# Patient Record
Sex: Male | Born: 1960 | Race: Black or African American | Hispanic: No | Marital: Married | Smoking: Never smoker
Health system: Southern US, Community
[De-identification: ages and names within clinical notes are randomized; demographics above are authoritative.]

## PROBLEM LIST (undated history)

## (undated) ENCOUNTER — Emergency Department (HOSPITAL_BASED_OUTPATIENT_CLINIC_OR_DEPARTMENT_OTHER): Payer: BC Managed Care – PPO | Source: Home / Self Care

## (undated) DIAGNOSIS — N529 Male erectile dysfunction, unspecified: Secondary | ICD-10-CM

## (undated) DIAGNOSIS — Z9889 Other specified postprocedural states: Secondary | ICD-10-CM

## (undated) HISTORY — PX: KNEE SURGERY: SHX244

## (undated) HISTORY — DX: Male erectile dysfunction, unspecified: N52.9

## (undated) HISTORY — DX: Other specified postprocedural states: Z98.890

---

## 2010-03-18 DIAGNOSIS — Z9889 Other specified postprocedural states: Secondary | ICD-10-CM

## 2010-03-18 HISTORY — PX: SHOULDER SURGERY: SHX246

## 2010-03-18 HISTORY — DX: Other specified postprocedural states: Z98.890

## 2010-09-14 LAB — HM COLONOSCOPY

## 2013-09-24 ENCOUNTER — Telehealth: Payer: Self-pay

## 2013-09-24 NOTE — Telephone Encounter (Signed)
LM on VM No pertinent data in chart

## 2013-09-27 ENCOUNTER — Encounter: Payer: Self-pay | Admitting: Internal Medicine

## 2013-09-27 ENCOUNTER — Ambulatory Visit (INDEPENDENT_AMBULATORY_CARE_PROVIDER_SITE_OTHER): Payer: BC Managed Care – PPO | Admitting: Internal Medicine

## 2013-09-27 VITALS — BP 109/67 | HR 66 | Temp 97.6°F | Ht 66.2 in | Wt 204.0 lb

## 2013-09-27 DIAGNOSIS — K589 Irritable bowel syndrome without diarrhea: Secondary | ICD-10-CM

## 2013-09-27 DIAGNOSIS — Z8042 Family history of malignant neoplasm of prostate: Secondary | ICD-10-CM

## 2013-09-27 DIAGNOSIS — R6882 Decreased libido: Secondary | ICD-10-CM

## 2013-09-27 NOTE — Patient Instructions (Signed)
Get your blood work before you leave   Next visit is for a physical exam in 3 months, fasting Please make an appointment    Get records from the previous doctor: Colonoscopy report Recent labs  XRs

## 2013-09-27 NOTE — Progress Notes (Signed)
Subjective:    Patient ID: Jordan CornfieldMichael Danzer, male    DOB: 10-May-1960, 53 y.o.   MRN: [id number]  DOS:  09/27/2013 Type of visit - description: new pt, several concerns  History: IBS? The patient reports that he has one or 2 bowel movements a day, sometimes has a BM  immediately after he eats certain foods such as fruits and wife thinks that maybe IBS. Also complaining of decreased sex drive for the last 2 years and occ  erectile dysfunction. His marriage is healthy, no major stressors in his life. He is also concerned about his family history of prostate cancer, prostate exam?   ROS Denies nausea, vomiting, no constipation/diarrhea although sometimes the stools are loose. No abdominal pain. Does have occasional heartburn. occ  sees red blood in the stools, previous colonoscopy negative, no increasing sx. Denies dysuria, gross hematuria or difficulty urinating. + Nocturia " all my adult life" goes to the bathroom at night 1-2 qhs   Past Medical History  Diagnosis Date  . H/O colonoscopy 2012    [place]    Past Surgical History  Procedure Laterality Date  . Knee surgery      x 3   . Shoulder surgery Left 2012    History   Social History  . Marital Status: Married    Spouse Name: N/A    Number of Children: 0  . Years of Education: N/A   Occupational History  . Warehouse managercollege administrator     Social History Main Topics  . Smoking status: Never Smoker   . Smokeless tobacco: Not on file  . Alcohol Use: No  . Drug Use: Not on file  . Sexual Activity: Not on file   Other Topics Concern  . Not on file   Social History Narrative   [place]oved from [place]     Family History  Problem Relation Age of Onset  . Colon cancer Neg Hx   . Prostate cancer Father 2960  . Diabetes Sister     sister and aunt  . CAD Father 5059       Medication List    Notice As of 09/27/2013 11:59 PM   You have not been prescribed any medications.         Objective:   Physical  Exam BP 109/67  Pulse 66  Temp(Src) 97.6 F (36.4 C)  Ht 5' 6.2" (1.681 m)  Wt 204 lb (92.534 kg)  BMI 32.75 kg/m2  SpO2 94%  General -- alert, well-developed, NAD.  HEENT-- Not pale. Lungs -- normal respiratory effort, no intercostal retractions, no accessory muscle use, and normal breath sounds.  Heart-- normal rate, regular rhythm, no murmur.  Abdomen-- Not distended, good bowel sounds,soft, non-tender. No rebound or rigidity. No mass,organomegaly. Rectal-- No external abnormalities noted. Normal sphincter tone. No rectal masses or tenderness. Stool brown  Prostate--Prostate gland firm and smooth, no enlargement, nodularity, tenderness, mass, asymmetry or induration. Extremities-- no pretibial edema bilaterally  Neurologic--  alert & oriented X3. Speech normal, gait appropriate for age, strength symmetric and appropriate for age.  Psych-- Cognition and judgment appear intact. Cooperative with normal attention span and concentration. No anxious or depressed appearing.        Assessment & Plan:   IBS? Symptoms do not suggest IBS. He sees red blood per rectum sometimes but that's her chronic issue, on and off, had a negative colonoscopy before. He does have occasional heartburn. Check a CBC to rule out anemia.  Family history  of prostate cancer, DRE negative, check a PSA.  Decreased sex drive, ED: Check a testosterone level, TSH, BMP

## 2013-09-27 NOTE — Progress Notes (Signed)
Pre visit review using our clinic review tool, if applicable. No additional management support is needed unless otherwise documented below in the visit note. 

## 2013-09-28 LAB — BASIC METABOLIC PANEL
BUN: 10 mg/dL (ref 6–23)
CO2: 26 meq/L (ref 19–32)
Calcium: 9.5 mg/dL (ref 8.4–10.5)
Chloride: 104 mEq/L (ref 96–112)
Creatinine, Ser: 1.1 mg/dL (ref 0.4–1.5)
GFR: 89.43 mL/min (ref >60.00)
GLUCOSE: 110 mg/dL — AB (ref 70–99)
POTASSIUM: 3.8 meq/L (ref 3.5–5.1)
SODIUM: 139 meq/L (ref 135–145)

## 2013-09-28 LAB — TESTOSTERONE, FREE, TOTAL, SHBG
Sex Hormone Binding: 35 nmol/L (ref 13–71)
TESTOSTERONE FREE: 54.2 pg/mL (ref 47.0–244.0)
TESTOSTERONE-% FREE: 1.9 % (ref 1.6–2.9)
Testosterone: 287 ng/dL — ABNORMAL LOW (ref 300–890)

## 2013-09-28 LAB — CBC WITH DIFFERENTIAL/PLATELET
Basophils Absolute: 0 10*3/uL (ref 0.0–0.1)
Basophils Relative: 0.4 % (ref 0.0–3.0)
EOS PCT: 4 % (ref 0.0–5.0)
Eosinophils Absolute: 0.2 10*3/uL (ref 0.0–0.7)
HCT: 42 % (ref 39.0–52.0)
Hemoglobin: 13.8 g/dL (ref 13.0–17.0)
Lymphocytes Relative: 41.4 % (ref 12.0–46.0)
Lymphs Abs: 2.3 10*3/uL (ref 0.7–4.0)
MCHC: 33 g/dL (ref 30.0–36.0)
MCV: 88.4 fl (ref 78.0–100.0)
Monocytes Absolute: 0.7 10*3/uL (ref 0.1–1.0)
Monocytes Relative: 12.1 % — ABNORMAL HIGH (ref 3.0–12.0)
NEUTROS PCT: 42.1 % — AB (ref 43.0–77.0)
Neutro Abs: 2.3 10*3/uL (ref 1.4–7.7)
Platelets: 227 10*3/uL (ref 150.0–400.0)
RBC: 4.75 Mil/uL (ref 4.22–5.81)
RDW: 13.4 % (ref 11.5–15.5)
WBC: 5.5 10*3/uL (ref 4.0–10.5)

## 2013-09-28 LAB — TSH: TSH: 0.65 u[IU]/mL (ref 0.35–4.50)

## 2013-09-28 LAB — PSA: PSA: 2.63 ng/mL (ref 0.10–4.00)

## 2013-09-29 ENCOUNTER — Encounter: Payer: Self-pay | Admitting: *Deleted

## 2013-12-20 ENCOUNTER — Encounter: Payer: Self-pay | Admitting: Internal Medicine

## 2013-12-20 ENCOUNTER — Ambulatory Visit (INDEPENDENT_AMBULATORY_CARE_PROVIDER_SITE_OTHER): Payer: BC Managed Care – PPO | Admitting: Internal Medicine

## 2013-12-20 VITALS — BP 131/74 | HR 72 | Temp 98.4°F | Ht 66.0 in | Wt 207.0 lb

## 2013-12-20 DIAGNOSIS — K589 Irritable bowel syndrome without diarrhea: Secondary | ICD-10-CM

## 2013-12-20 DIAGNOSIS — Z0000 Encounter for general adult medical examination without abnormal findings: Secondary | ICD-10-CM

## 2013-12-20 DIAGNOSIS — Z23 Encounter for immunization: Secondary | ICD-10-CM

## 2013-12-20 NOTE — Assessment & Plan Note (Addendum)
Tdap today Had a flu shot 2 weeks ago Had a colonoscopy in 2012, polyps?. Asked patient to get reports. Diet exercise discussed Recent PSA normal. EKG for baseline today-- nsr  Labs, see instructions Recently complaining of decreased libido-ED, here lately is doing better, free testosterone was in the low side of normal, plan to recheck testosterone level next year. Also ?ADD ---> provided name of our counselors

## 2013-12-20 NOTE — Patient Instructions (Signed)
Stop by the front desk and schedule labs to be done within few days (fasting)  Please get the records of the previous colonoscopy including when they requested  you to go back  Please come back to the office in 1 year  for a physical exam. Come back fasting      REMINDERS  If we are ordering labs, XRs or referring you to a specialist : we will always communicate to you the results or the time of the appointment within few days. If you don't hear from us please call the office

## 2013-12-20 NOTE — Progress Notes (Signed)
Pre visit review using our clinic review tool, if applicable. No additional management support is needed unless otherwise documented below in the visit note. 

## 2013-12-20 NOTE — Assessment & Plan Note (Signed)
See previous OV note, sx suggested IBS, h/o Cscope 2012, recent CBC normal Plan-- observe for now

## 2013-12-20 NOTE — Progress Notes (Signed)
   Subjective:    Patient ID: Jordan Melendez, male    DOB: 1960/04/25, 53 y.o.   MRN: [id number]  DOS:  12/20/2013 Type of visit - description : CPX Interval history: Since the last office visit he is doing well. Here for physical exam    ROS Denies chest pain, difficulty breathing. No heartburn per se, no dysphagia or odynophagia. No cough or bronchial congestion. Some stress at work, no anxiety- depression per se. For long time he suspected he has some degree of ADD.   Past Medical History  Diagnosis Date  . H/O colonoscopy 2012    [place] , [place]    Past Surgical History  Procedure Laterality Date  . Knee surgery      x 3   . Shoulder surgery Left 2012    History   Social History  . Marital Status: Married    Spouse Name: N/A    Number of Children: 0  . Years of Education: N/A   Occupational History  . Warehouse managercollege administrator     Social History Main Topics  . Smoking status: Never Smoker   . Smokeless tobacco: Not on file  . Alcohol Use: No  . Drug Use: Not on file  . Sexual Activity: Not on file   Other Topics Concern  . Not on file   Social History Narrative   [place]   Household-- pt and wife     Family History  Problem Relation Age of Onset  . Colon cancer Neg Hx   . Prostate cancer Father 2360  . Diabetes Sister     sister and aunt  . CAD Father 8459  . Stroke Other     GM , late onset   . Hypertension Other     several fam members        Medication List    Notice As of 12/20/2013  5:49 PM   You have not been prescribed any medications.         Objective:   Physical Exam BP 131/74  Pulse 72  Temp(Src) 98.4 F (36.9 C) (Oral)  Ht 5\' 6"  (1.676 m)  Wt 207 lb (93.895 kg)  BMI 33.43 kg/m2  SpO2 97% General -- alert, well-developed, NAD.  Neck --no thyromegaly , normal carotid pulse  HEENT-- Not pale.  Lungs -- normal respiratory effort, no intercostal retractions, no accessory muscle use, and normal breath  sounds.  Heart-- normal rate, regular rhythm, no murmur.  Abdomen-- Not distended, good bowel sounds,soft, non-tender. Extremities-- no pretibial edema bilaterally  Neurologic--  alert & oriented X3. Speech normal, gait appropriate for age, strength symmetric and appropriate for age.  Psych-- Cognition and judgment appear intact. Cooperative with normal attention span and concentration. No anxious or depressed appearing.     Assessment & Plan:

## 2013-12-31 ENCOUNTER — Other Ambulatory Visit (INDEPENDENT_AMBULATORY_CARE_PROVIDER_SITE_OTHER): Payer: BC Managed Care – PPO

## 2013-12-31 DIAGNOSIS — Z0000 Encounter for general adult medical examination without abnormal findings: Secondary | ICD-10-CM

## 2013-12-31 LAB — AST: AST: 21 U/L (ref 0–37)

## 2013-12-31 LAB — LIPID PANEL
Cholesterol: 167 mg/dL (ref 0–200)
HDL: 28.7 mg/dL — ABNORMAL LOW (ref >39.00)
LDL Cholesterol: 118 mg/dL — ABNORMAL HIGH (ref 0–99)
NONHDL: 138.3
Total CHOL/HDL Ratio: 6
Triglycerides: 101 mg/dL (ref 0.0–149.0)
VLDL: 20.2 mg/dL (ref 0.0–40.0)

## 2013-12-31 LAB — HEMOGLOBIN A1C: Hgb A1c MFr Bld: 6 % (ref 4.6–6.5)

## 2013-12-31 LAB — ALT: ALT: 23 U/L (ref 0–53)

## 2014-04-12 ENCOUNTER — Ambulatory Visit (HOSPITAL_COMMUNITY): Payer: BC Managed Care – PPO

## 2014-04-12 ENCOUNTER — Encounter: Payer: Self-pay | Admitting: Medical

## 2014-04-12 ENCOUNTER — Ambulatory Visit (HOSPITAL_BASED_OUTPATIENT_CLINIC_OR_DEPARTMENT_OTHER)
Admission: RE | Admit: 2014-04-12 | Discharge: 2014-04-12 | Disposition: A | Discharge status: Home / Self Care | Payer: BC Managed Care – PPO | Source: Ambulatory Visit | Attending: Medical | Admitting: Medical

## 2014-04-12 ENCOUNTER — Ambulatory Visit (INDEPENDENT_AMBULATORY_CARE_PROVIDER_SITE_OTHER): Payer: BC Managed Care – PPO | Admitting: Medical

## 2014-04-12 VITALS — BP 126/74 | HR 65 | Temp 98.3°F | Ht 66.0 in | Wt 203.4 lb

## 2014-04-12 DIAGNOSIS — M25571 Pain in right ankle and joints of right foot: Secondary | ICD-10-CM

## 2014-04-12 DIAGNOSIS — M79671 Pain in right foot: Secondary | ICD-10-CM | POA: Insufficient documentation

## 2014-04-12 DIAGNOSIS — M79673 Pain in unspecified foot: Secondary | ICD-10-CM | POA: Insufficient documentation

## 2014-04-12 NOTE — Assessment & Plan Note (Signed)
For you right foot pain, you may have corn, callous  Or  plantar wart. I recommend low dose ibuprofen otc 200 mg every 8 hours for pain and use mole skin/other type product to reduce friction/ give padding  With you prediabetic stat.us, I will refer yo to podiatrist for tx

## 2014-04-12 NOTE — Progress Notes (Signed)
   Subjective:    Patient ID: Jordan Melendez, male    DOB: 30-May-1960, 54 y.o.   MRN: [id number]  HPI   About one month ago pain after pedicure. He does not report any partictular trauma to his foot from pedicure but speculated maybe.  Pain started about 3 weeks ago(one week after pedicure). No injury of fall. Pt is prediabetic. He described area that feels little hard and is tender below rt 5th toe. He points to 5th  distal metatarsal head region.  Pt not wearing new shoes. He does not report friction injury.  Mild irritating pain. 3-4/10 level pain but gradually getting worse.  Past Medical History  Diagnosis Date  . H/O colonoscopy 2012    [place] , [place]    History   Social History  . Marital Status: Married    Spouse Name: N/A    Number of Children: 0  . Years of Education: N/A   Occupational History  . Warehouse managercollege administrator     Social History Main Topics  . Smoking status: Never Smoker   . Smokeless tobacco: Not on file  . Alcohol Use: No  . Drug Use: Not on file  . Sexual Activity: Not on file   Other Topics Concern  . Not on file   Social History Narrative   [place]oved from [place] 2014   Household-- pt and wife    Past Surgical History  Procedure Laterality Date  . Knee surgery      x 3   . Shoulder surgery Left 2012    Family History  Problem Relation Age of Onset  . Colon cancer Neg Hx   . Prostate cancer Father 160  . Diabetes Sister     sister and aunt  . CAD Father 5159  . Stroke Other     GM , late onset   . Hypertension Other     several fam members     No Known Allergies  No current outpatient prescriptions on file prior to visit.   No current facility-administered medications on file prior to visit.    BP 126/74 mmHg  Pulse 65  Temp(Src) 98.3 F (36.8 C) (Oral)  Ht 5\' 6"  (1.676 m)  Wt 203 lb 6.4 oz (92.262 kg)  BMI 32.85 kg/m2  SpO2 94%     Review of Systems  Constitutional: Negative for fever, chills and fatigue.    Endocrine: Negative for polyphagia and polyuria.  Musculoskeletal:       Rt foot pain. See hop for location description.  Skin: Negative for rash.       Objective:   Physical Exam   General- no acute distress.  Rt foot- base of fifth toe.feels like corn verses plantar wart. The area of skin feels thickened an little raised. When squeezed on side moderate pain. On close inspsection with magnification does not appear plantar wart like. No breakdown of skin. No redness or warmth. Normal pulses.        Assessment & Plan:  Pt heel looks good. I don't know why staff put heel injury. He did not have any heel injury.

## 2014-04-12 NOTE — Patient Instructions (Addendum)
For you right foot pain, you may have corn vs plantar wart. I recommend low dose ibuprofen otc 200 mg every 8 hours for pain and use mole skin/other type product to reduce friction.  With you prediabetic stat.us, I will refer yo to podiatrist for tx  I do want you to get xray of your rt foot today.  Follow up with us as needed before your podiatrist appointment

## 2014-04-12 NOTE — Progress Notes (Signed)
Pre visit review using our clinic review tool, if applicable. No additional management support is needed unless otherwise documented below in the visit note. 

## 2014-04-20 ENCOUNTER — Telehealth: Payer: Self-pay

## 2014-04-20 NOTE — Telephone Encounter (Signed)
Received [facility] call form via fax regarding Pt. Pt requested someone at office call back. Returned Pts call and spoke with Pt regarding erectile dysfunction. Pt informed me at last visit his testosterone is slightly low and he is having more problems. He wanted to call to see if he could get something called into his pharmacy( Pt is currently out of town, will get address of local pharmacy if medication is approved), informed Pt I would send a message to Dr. Drue NovelPaz regarding ED medication. Pt verbalized understanding.

## 2014-04-20 NOTE — Telephone Encounter (Signed)
Ok to send viagra 100 mg: 1/2 to 1 tab a day prn #5, 1 RF Needs to read s/e carefully, stop viagra and call if s/e

## 2014-04-20 NOTE — Telephone Encounter (Signed)
Spoke with Pt, informed him Dr. Drue NovelPaz has agreed to the Viagra 100 mg: 1/2 to 1 tablet prn. Pt will call back with pharmacy information to send the prescription to.

## 2014-04-21 MED ORDER — SILDENAFIL CITRATE 100 MG PO TABS: 50.0000 mg | ORAL_TABLET | Freq: Every day | ORAL | Status: DC | PRN

## 2014-04-21 NOTE — Telephone Encounter (Signed)
Received fax from [facility] regarding pharmacy information to send Viagra. Viagra sent to [facility]: [address]  [place], [place].

## 2014-04-28 ENCOUNTER — Ambulatory Visit: Payer: BC Managed Care – PPO | Admitting: Podiatry

## 2014-05-05 ENCOUNTER — Ambulatory Visit: Payer: BC Managed Care – PPO

## 2014-05-05 ENCOUNTER — Encounter: Payer: Self-pay | Admitting: Podiatry

## 2014-05-05 ENCOUNTER — Ambulatory Visit (INDEPENDENT_AMBULATORY_CARE_PROVIDER_SITE_OTHER): Payer: BC Managed Care – PPO | Admitting: Podiatry

## 2014-05-05 VITALS — BP 106/68 | HR 71 | Resp 16 | Ht 66.0 in | Wt 202.0 lb

## 2014-05-05 DIAGNOSIS — M779 Enthesopathy, unspecified: Secondary | ICD-10-CM

## 2014-05-05 DIAGNOSIS — Q828 Other specified congenital malformations of skin: Secondary | ICD-10-CM

## 2014-05-05 NOTE — Progress Notes (Signed)
   Subjective:    Patient ID: Jordan Melendez, male    DOB: 06-01-1960, 54 y.o.   MRN: [id number]  HPI Comments: Feels like a cracked callus, it has been there a couple of month, doctor xrayed it. Use moleskin on it  Foot Pain      Review of Systems  HENT:       Cold   All other systems reviewed and are negative.      Objective:   Physical Exam: I have reviewed his past mental history medications allergies surgery social history and review of systems. Pulses are strongly palpable bilateral. Neurologic sensorium is intact and deep tendon reflexes are intact. Muscle strength is adequate bilateral. Orthopedic evaluation of his straight palpable area of inflammation beneath fifth metatarsal head of the right foot with an overlying reactive hyperkeratotic lesion or porokeratosis. This does not appear to be verrucoid in nature.        Assessment & Plan:  Assessment: Capsulitis porokeratosis fifth metatarsophalangeal joint right foot.  Plan: Injected today with dexamethasone and local anesthetic 2 mg was utilized. Debrided reactive hyperkeratoses discussed appropriate shoe gear. I will follow-up with him in the near future if necessary.

## 2014-12-21 ENCOUNTER — Telehealth: Payer: Self-pay

## 2014-12-21 NOTE — Telephone Encounter (Signed)
Attempted PVC pt states he will call back

## 2014-12-23 ENCOUNTER — Encounter: Payer: BC Managed Care – PPO | Admitting: Internal Medicine

## 2014-12-23 ENCOUNTER — Telehealth: Payer: Self-pay | Admitting: Internal Medicine

## 2015-01-02 NOTE — Telephone Encounter (Signed)
No charge. 

## 2015-01-02 NOTE — Telephone Encounter (Signed)
PT WAS NO SHOW 12/23/14 9:00AM, CPE APPT, PT IS RESCHEDULED FOR 01/04/15, CHARGE OR NO CHARGE?

## 2015-01-04 ENCOUNTER — Ambulatory Visit (INDEPENDENT_AMBULATORY_CARE_PROVIDER_SITE_OTHER): Payer: BC Managed Care – PPO | Admitting: Internal Medicine

## 2015-01-04 ENCOUNTER — Encounter: Payer: Self-pay | Admitting: Internal Medicine

## 2015-01-04 VITALS — BP 106/66 | HR 66 | Temp 97.7°F | Ht 66.0 in | Wt 205.0 lb

## 2015-01-04 DIAGNOSIS — Z114 Encounter for screening for human immunodeficiency virus [HIV]: Secondary | ICD-10-CM

## 2015-01-04 DIAGNOSIS — Z09 Encounter for follow-up examination after completed treatment for conditions other than malignant neoplasm: Secondary | ICD-10-CM

## 2015-01-04 DIAGNOSIS — Z0000 Encounter for general adult medical examination without abnormal findings: Secondary | ICD-10-CM

## 2015-01-04 DIAGNOSIS — R7303 Prediabetes: Secondary | ICD-10-CM | POA: Diagnosis not present

## 2015-01-04 DIAGNOSIS — Z1159 Encounter for screening for other viral diseases: Secondary | ICD-10-CM

## 2015-01-04 LAB — BASIC METABOLIC PANEL
BUN: 13 mg/dL (ref 6–23)
CALCIUM: 9.3 mg/dL (ref 8.4–10.5)
CO2: 31 mEq/L (ref 19–32)
CREATININE: 1.13 mg/dL (ref 0.40–1.50)
Chloride: 104 mEq/L (ref 96–112)
GFR: 87.18 mL/min (ref >60.00)
Glucose, Bld: 90 mg/dL (ref 70–99)
Potassium: 4 mEq/L (ref 3.5–5.1)
SODIUM: 141 meq/L (ref 135–145)

## 2015-01-04 LAB — PSA: PSA: 2.18 ng/mL (ref 0.10–4.00)

## 2015-01-04 LAB — LIPID PANEL
CHOLESTEROL: 176 mg/dL (ref 0–200)
HDL: 40.1 mg/dL (ref >39.00)
LDL Cholesterol: 113 mg/dL — ABNORMAL HIGH (ref 0–99)
NONHDL: 135.94
TRIGLYCERIDES: 114 mg/dL (ref 0.0–149.0)
Total CHOL/HDL Ratio: 4
VLDL: 22.8 mg/dL (ref 0.0–40.0)

## 2015-01-04 LAB — HEMOGLOBIN A1C: HEMOGLOBIN A1C: 5.7 % (ref 4.6–6.5)

## 2015-01-04 NOTE — Progress Notes (Signed)
Subjective:    Patient ID: Jordan Melendez, male    DOB: 06/08/1960, 54 y.o.   MRN: [id number]  DOS:  01/04/2015 Type of visit - description :  CPX Interval history: In general feeling well.  Wt Readings from Last 3 Encounters:  01/04/15 205 lb (92.987 kg)  05/05/14 202 lb (91.627 kg)  04/12/14 203 lb 6.4 oz (92.262 kg)     Review of Systems Constitutional: No fever. No chills. No unexplained wt changes. No unusual sweats  HEENT: No dental problems, no ear discharge, no facial swelling, no voice changes. No eye discharge, no eye  redness , no  intolerance to light   Respiratory: No wheezing , no  difficulty breathing. No cough , no mucus production  Cardiovascular: No CP, no leg swelling , no  Palpitations  GI: no nausea, no vomiting, no diarrhea , no  abdominal pain.  No blood in the stools. No dysphagia, no odynophagia    Endocrine: No polyphagia, no polyuria , no polydipsia  GU: No dysuria, gross hematuria, difficulty urinating. No urinary urgency, no frequency.  Musculoskeletal: No joint swellings , in need of right shoulder surgery at some point in the next few months  Skin: No change in the color of the skin, palor , no  Rash  Allergic, immunologic: No environmental allergies , no  food allergies  Neurological: No dizziness no  syncope. No headaches. No diplopia, no slurred, no slurred speech, no motor deficits, no facial  Numbness  Hematological: No enlarged lymph nodes, no easy bruising , no unusual bleedings  Psychiatry: No suicidal ideas, no hallucinations, no beavior problems, no confusion.  No unusual/severe anxiety, no depression   Past Medical History  Diagnosis Date  . H/O colonoscopy 2012    [place]    Past Surgical History  Procedure Laterality Date  . Knee surgery      x 3   . Shoulder surgery Left 2012    Social History   Social History  . Marital Status: Married    Spouse Name: N/A  . Number of Children: 0  . Years of  Education: N/A   Occupational History  . Warehouse managercollege administrator     Social History Main Topics  . Smoking status: Never Smoker   . Smokeless tobacco: Not on file  . Alcohol Use: No  . Drug Use: Not on file  . Sexual Activity: Not on file   Other Topics Concern  . Not on file   Social History Narrative   [place]oved from [place]   Household-- pt and wife     Family History  Problem Relation Age of Onset  . Colon cancer Neg Hx   . Prostate cancer Father 1860  . Diabetes Sister     sister and aunt  . CAD Father 3459  . Stroke Other     GM , late onset   . Hypertension Other     several fam members        Medication List    Notice  As of 01/04/2015  5:54 PM   You have not been prescribed any medications.         Objective:   Physical Exam BP 106/66 mmHg  Pulse 66  Temp(Src) 97.7 F (36.5 C) (Oral)  Ht 5\' 6"  (1.676 m)  Wt 205 lb (92.987 kg)  BMI 33.10 kg/m2  SpO2 97% General:   Well developed, well nourished . NAD.  Neck:  Full range of motion. Supple.  No  thyromegaly , normal carotid pulse HEENT:  Normocephalic . Face symmetric, atraumatic Lungs:  CTA B Normal respiratory effort, no intercostal retractions, no accessory muscle use. Heart: RRR,  no murmur.  No pretibial edema bilaterally  Abdomen:  Not distended, soft, non-tender. No rebound or rigidity.  Rectal:  External abnormalities: none. Normal sphincter tone. No rectal masses or tenderness.  Stool brown  Prostate: Prostate gland firm and smooth, no enlargement, nodularity, tenderness, mass, asymmetry or induration.  Skin: Exposed areas without rash. Not pale. Not jaundice Neurologic:  alert & oriented X3.  Speech normal, gait appropriate for age and unassisted Strength symmetric and appropriate for age.  Psych: Cognition and judgment appear intact.  Cooperative with normal attention span and concentration.  Behavior appropriate. No anxious or depressed appearing.    Assessment & Plan:    Assessment> Prediabetes  ED- infrequent sx  MSK: See surgical history.  Plan  Diabetes: Diet and exercise discussed, check A1c Last year free testosterone was in the lower  side of normal. Recheck. RTC one year

## 2015-01-04 NOTE — Assessment & Plan Note (Signed)
Diabetes: Diet and exercise discussed, check A1c Last year free testosterone was in the lower  side of normal. Recheck. RTC one year

## 2015-01-04 NOTE — Progress Notes (Signed)
Pre visit review using our clinic review tool, if applicable. No additional management support is needed unless otherwise documented below in the visit note. 

## 2015-01-04 NOTE — Patient Instructions (Signed)
Get your blood work before you leave   Nutritionist Rodman CompSarah Swindell [phone]  Next visit  for a  complete physical exam in one year.     Please schedule an appointment at the front desk Please come back fasting

## 2015-01-04 NOTE — Assessment & Plan Note (Addendum)
Tdap 2015 Plans to get a  flu shot soon +FH DM,CAD, prostate ca  Had a colonoscopy in 2012, polyps? No report, asked pt to get it  DRE normal today, check a PSA Diet exercise discussed  , likes to see a nutritionist, see AVS  Labs, see instructions

## 2015-01-05 LAB — TESTOSTERONE, FREE, TOTAL, SHBG
Sex Hormone Binding: 30 nmol/L (ref 10–50)
Testosterone, Free: 76.8 pg/mL (ref 47.0–244.0)
Testosterone-% Free: 2.1 % (ref 1.6–2.9)
Testosterone: 362 ng/dL (ref 300–890)

## 2015-01-05 LAB — HIV ANTIBODY (ROUTINE TESTING W REFLEX): HIV: NONREACTIVE

## 2015-01-05 LAB — HEPATITIS C ANTIBODY: HCV AB: NEGATIVE

## 2015-03-22 ENCOUNTER — Encounter: Payer: Self-pay | Admitting: Internal Medicine

## 2015-03-22 ENCOUNTER — Ambulatory Visit (INDEPENDENT_AMBULATORY_CARE_PROVIDER_SITE_OTHER): Payer: BC Managed Care – PPO | Admitting: Internal Medicine

## 2015-03-22 VITALS — BP 112/76 | HR 66 | Temp 97.6°F | Ht 66.0 in | Wt 207.0 lb

## 2015-03-22 DIAGNOSIS — H9312 Tinnitus, left ear: Secondary | ICD-10-CM

## 2015-03-22 NOTE — Progress Notes (Signed)
   Subjective:    Patient ID: Jordan Melendez, male    DOB: [birth year], 55 y.o.   MRN: [id number]  DOS:  03/22/2015 Type of visit - description : Acute visit Interval history: Symptoms started suddenly 3 days ago with mild left ear tinnitus, muffled hearing Since then, the symptoms are not better or worse.  Review of Systems Denies any fever chills No dizziness No actual ear pain or discharge No recent URI symptoms  Past Medical History  Diagnosis Date  . H/O colonoscopy 2012    [place]    Past Surgical History  Procedure Laterality Date  . Knee surgery      x 3   . Shoulder surgery Left 2012    Social History   Social History  . Marital Status: Married    Spouse Name: N/A  . Number of Children: 0  . Years of Education: N/A   Occupational History  . Warehouse managercollege administrator     Social History Main Topics  . Smoking status: Never Smoker   . Smokeless tobacco: Not on file  . Alcohol Use: No  . Drug Use: Not on file  . Sexual Activity: Not on file   Other Topics Concern  . Not on file   Social History Narrative   [place]oved from [place]   Household-- pt and wife        Medication List    Notice  As of 03/22/2015  6:38 PM   You have not been prescribed any medications.         Objective:   Physical Exam BP 112/76 mmHg  Pulse 66  Temp(Src) 97.6 F (36.4 C) (Oral)  Ht 5\' 6"  (1.676 m)  Wt 207 lb (93.895 kg)  BMI 33.43 kg/m2  SpO2 99% General:   Well developed, well nourished . NAD.  HEENT:  Normocephalic . Face symmetric, atraumatic. Nose: Nose congested R TM: Normal, mild amount of wax noted, partially removed with a spoon Left TM: Slightly bulge, no red, no discharge, no wax.  throat: Symmetric, no red no discharge Neurologic:  alert & oriented X3.  Speech normal, gait appropriate for age and unassisted Psych--  Cognition and judgment appear intact.  Cooperative with normal attention span and concentration.  Behavior  appropriate. No anxious or depressed appearing.      Assessment & Plan:   Assessment> Prediabetes  ED- infrequent sx  MSK: See surgical history.  Plan    Tinnitus: Mild tinnitus and muffled hearing, exam is essentially normal except for possible serous otitis. Recommend Flonase consistently for the next few weeks, if not improving he will call for ENT referral. Next visit for a CPX 12-2015

## 2015-03-22 NOTE — Patient Instructions (Addendum)
Use flonase 2 sprays on each side of the nose every day x 3-4 weeks  If no better in 10-14 days: call the office for a referral   Nutritionist Rodman CompSarah Swindell [phone]

## 2015-03-22 NOTE — Progress Notes (Signed)
Pre visit review using our clinic review tool, if applicable. No additional management support is needed unless otherwise documented below in the visit note. 

## 2015-05-05 DIAGNOSIS — Q828 Other specified congenital malformations of skin: Secondary | ICD-10-CM

## 2015-07-21 ENCOUNTER — Ambulatory Visit (INDEPENDENT_AMBULATORY_CARE_PROVIDER_SITE_OTHER): Payer: BC Managed Care – PPO | Admitting: Internal Medicine

## 2015-07-21 ENCOUNTER — Encounter: Payer: Self-pay | Admitting: Internal Medicine

## 2015-07-21 VITALS — BP 90/50 | HR 70 | Temp 97.9°F | Ht 66.0 in | Wt 207.4 lb

## 2015-07-21 DIAGNOSIS — M25511 Pain in right shoulder: Secondary | ICD-10-CM | POA: Diagnosis not present

## 2015-07-21 DIAGNOSIS — Z09 Encounter for follow-up examination after completed treatment for conditions other than malignant neoplasm: Secondary | ICD-10-CM

## 2015-07-21 NOTE — Progress Notes (Signed)
Pre visit review using our clinic review tool, if applicable. No additional management support is needed unless otherwise documented below in the visit note. 

## 2015-07-21 NOTE — Patient Instructions (Signed)
  Nutritionist Rodman CompSarah Swindell [phone]

## 2015-07-21 NOTE — Progress Notes (Signed)
   Subjective:    Patient ID: Jordan Melendez, male    DOB: [birth year], 55 y.o.   MRN: [id number]  DOS:  07/21/2015 Type of visit - description : Acute visit Interval history:  On and off right shoulder pain anteriorly for a while, similar to previous issues on the left side where he had surgery and is now doing well. Request orthopedic referral.  Review of Systems   denies neck pain No lower extremity paresthesias Past Medical History  Diagnosis Date  . H/O colonoscopy 2012    [place]    Past Surgical History  Procedure Laterality Date  . Knee surgery      x 3   . Shoulder surgery Left 2012    Social History   Social History  . Marital Status: Married    Spouse Name: N/A  . Number of Children: 0  . Years of Education: N/A   Occupational History  . Warehouse managercollege administrator     Social History Main Topics  . Smoking status: Never Smoker   . Smokeless tobacco: Not on file  . Alcohol Use: No  . Drug Use: Not on file  . Sexual Activity: Not on file   Other Topics Concern  . Not on file   Social History Narrative   [place]oved from [place]   Household-- pt and wife        Medication List    Notice  As of 07/21/2015 11:59 PM   You have not been prescribed any medications.         Objective:   Physical Exam BP 90/50 mmHg  Pulse 70  Temp(Src) 97.9 F (36.6 C) (Oral)  Ht 5\' 6"  (1.676 m)  Wt 207 lb 6.4 oz (94.076 kg)  BMI 33.49 kg/m2  SpO2 96% General:   Well developed, well nourished . NAD.  HEENT:  Normocephalic . Face symmetric, atraumatic MSK: Left shoulder normal Right shoulder: No TTP, no deformities, range of motion is definitely decreased compared to the left. Skin: Not pale. Not jaundice Neurologic:  alert & oriented X3.  Speech normal, gait appropriate for age and unassisted Psych--  Cognition and judgment appear intact.  Cooperative with normal attention span and concentration.  Behavior appropriate. No anxious or depressed  appearing.      Assessment & Plan:   Assessment> Prediabetes  ED- infrequent sx  MSK: See surgical history.  Plan: Right shoulder pain: Chronic issue, not taking any medication, range of motion is significantly decrease (frozen shoulder?). Will refer to orthopedic surgery Additionally, unable to sleep more than 5 or 6 hours but denies daytime somnolence, would like to sleep more >>> tips for healthy sleep provided. Melatonin OTC okay as well. RTC as schedule 12-2015.

## 2015-07-22 NOTE — Assessment & Plan Note (Signed)
Right shoulder pain: Chronic issue, not taking any medication, range of motion is significantly decrease (frozen shoulder?). Will refer to orthopedic surgery Additionally, unable to sleep more than 5 or 6 hours but denies daytime somnolence, would like to sleep more >>> tips for healthy sleep provided. Melatonin OTC okay as well. RTC as schedule 12-2015.

## 2016-01-09 ENCOUNTER — Encounter: Payer: Self-pay | Admitting: Internal Medicine

## 2016-01-09 ENCOUNTER — Ambulatory Visit (INDEPENDENT_AMBULATORY_CARE_PROVIDER_SITE_OTHER): Payer: BC Managed Care – PPO | Admitting: Internal Medicine

## 2016-01-09 ENCOUNTER — Telehealth: Payer: Self-pay

## 2016-01-09 VITALS — BP 118/68 | HR 59 | Temp 97.6°F | Resp 14 | Ht 66.0 in | Wt 201.0 lb

## 2016-01-09 DIAGNOSIS — R319 Hematuria, unspecified: Secondary | ICD-10-CM

## 2016-01-09 DIAGNOSIS — Z125 Encounter for screening for malignant neoplasm of prostate: Secondary | ICD-10-CM | POA: Diagnosis not present

## 2016-01-09 DIAGNOSIS — R739 Hyperglycemia, unspecified: Secondary | ICD-10-CM | POA: Diagnosis not present

## 2016-01-09 DIAGNOSIS — Z0000 Encounter for general adult medical examination without abnormal findings: Secondary | ICD-10-CM

## 2016-01-09 DIAGNOSIS — Z0001 Encounter for general adult medical examination with abnormal findings: Secondary | ICD-10-CM | POA: Diagnosis not present

## 2016-01-09 LAB — CBC WITH DIFFERENTIAL/PLATELET
BASOS PCT: 0.6 % (ref 0.0–3.0)
Basophils Absolute: 0 10*3/uL (ref 0.0–0.1)
EOS PCT: 5.9 % — AB (ref 0.0–5.0)
Eosinophils Absolute: 0.3 10*3/uL (ref 0.0–0.7)
HCT: 42.8 % (ref 39.0–52.0)
HEMOGLOBIN: 14.6 g/dL (ref 13.0–17.0)
LYMPHS ABS: 2.1 10*3/uL (ref 0.7–4.0)
Lymphocytes Relative: 43.6 % (ref 12.0–46.0)
MCHC: 34 g/dL (ref 30.0–36.0)
MCV: 85.9 fl (ref 78.0–100.0)
MONO ABS: 0.7 10*3/uL (ref 0.1–1.0)
Monocytes Relative: 13.7 % — ABNORMAL HIGH (ref 3.0–12.0)
Neutro Abs: 1.8 10*3/uL (ref 1.4–7.7)
Neutrophils Relative %: 36.2 % — ABNORMAL LOW (ref 43.0–77.0)
Platelets: 213 10*3/uL (ref 150.0–400.0)
RBC: 4.99 Mil/uL (ref 4.22–5.81)
RDW: 13.2 % (ref 11.5–15.5)
WBC: 4.8 10*3/uL (ref 4.0–10.5)

## 2016-01-09 LAB — BASIC METABOLIC PANEL
BUN: 14 mg/dL (ref 6–23)
CO2: 31 mEq/L (ref 19–32)
Calcium: 9.6 mg/dL (ref 8.4–10.5)
Chloride: 105 mEq/L (ref 96–112)
Creatinine, Ser: 1.08 mg/dL (ref 0.40–1.50)
GFR: 91.51 mL/min (ref >60.00)
Glucose, Bld: 95 mg/dL (ref 70–99)
POTASSIUM: 3.8 meq/L (ref 3.5–5.1)
SODIUM: 142 meq/L (ref 135–145)

## 2016-01-09 LAB — PSA: PSA: 2.61 ng/mL (ref 0.10–4.00)

## 2016-01-09 LAB — URINALYSIS, ROUTINE W REFLEX MICROSCOPIC
Bilirubin Urine: NEGATIVE
HGB URINE DIPSTICK: NEGATIVE
Ketones, ur: NEGATIVE
Leukocytes, UA: NEGATIVE
NITRITE: NEGATIVE
Specific Gravity, Urine: 1.015 (ref 1.000–1.030)
TOTAL PROTEIN, URINE-UPE24: NEGATIVE
Urine Glucose: NEGATIVE
Urobilinogen, UA: 0.2 (ref 0.0–1.0)
pH: 6 (ref 5.0–8.0)

## 2016-01-09 LAB — HEMOGLOBIN A1C: Hgb A1c MFr Bld: 5.8 % (ref 4.6–6.5)

## 2016-01-09 LAB — LIPID PANEL
CHOLESTEROL: 183 mg/dL (ref 0–200)
HDL: 41.8 mg/dL (ref >39.00)
LDL Cholesterol: 124 mg/dL — ABNORMAL HIGH (ref 0–99)
NonHDL: 141.33
Total CHOL/HDL Ratio: 4
Triglycerides: 88 mg/dL (ref 0.0–149.0)
VLDL: 17.6 mg/dL (ref 0.0–40.0)

## 2016-01-09 NOTE — Telephone Encounter (Signed)
Received fax confirmation 01/09/2016 at 1020.

## 2016-01-09 NOTE — Assessment & Plan Note (Signed)
Prediabetes: Check A1c, diet, exercise discussed. Refer to a nutritionist Right shoulder pain: saw  Ortho, he recommended PT first, patient thought surgery was indicated and was not really satisfied with advice. Pt  [place] [facility] [place] [facility]  [place] [facility] [place].  Question of hematuria, see ROS, will check a UA, urine culture RTC one year, CPX

## 2016-01-09 NOTE — Assessment & Plan Note (Addendum)
Tdap 2015; just had  a  flu shot  at work +FH DM,CAD, prostate ca  Had a colonoscopy in 2012, polyps? No report, pt signed a ROI will fax to the doctor he indicated (Dr Darrick MeigsBen [place] [phone])  DRE normal last year, check a PSA Diet exercise discussed   Labs: BMP, FLP, CBC, PSA, A1c

## 2016-01-09 NOTE — Progress Notes (Signed)
Pre visit review using our clinic review tool, if applicable. No additional management support is needed unless otherwise documented below in the visit note. 

## 2016-01-09 NOTE — Progress Notes (Signed)
Subjective:    Patient ID: Jordan Melendez, male    DOB: [birth year], 55 y.o.   MRN: [id number]  DOS:  01/09/2016 Type of visit - description : CPX Interval history: He has a few concerns, see ROS    Review of Systems  Constitutional: No fever. No chills. No unexplained wt changes. No unusual sweats  HEENT: No dental problems, no ear discharge, no facial swelling, no voice changes. No eye discharge, no eye  redness , no  intolerance to light   Respiratory: No wheezing , no  difficulty breathing. No cough , no mucus production  Cardiovascular: No CP, no leg swelling , no  Palpitations  GI: no nausea, no vomiting, no diarrhea , no  abdominal pain.  No blood in the stools. No dysphagia, no odynophagia    Endocrine: No polyphagia, no polyuria , no polydipsia  GU:  Few months ago, he thinks he saw traces of blood in the urine, urine itself was yellow in color, had a single event. At the time he had no other symptoms. No dysuria,   difficulty urinating. No urinary urgency, no frequency.  Musculoskeletal: Continue with right shoulder pain, saw orthopedic surgery.  Skin: No change in the color of the skin, palor , no  Rash  Allergic, immunologic: No environmental allergies , no  food allergies  Neurological: No dizziness no  syncope. No headaches. No diplopia, no slurred, no slurred speech, no motor deficits, no facial  Numbness  Hematological: No enlarged lymph nodes, no easy bruising , no unusual bleedings  Psychiatry: No suicidal ideas, no hallucinations, no beavior problems, no confusion.  No unusual/severe anxiety, no depression  Past Medical History:  Diagnosis Date  . H/O colonoscopy 2012   [name] , [place]    Past Surgical History:  Procedure Laterality Date  . KNEE SURGERY     x 3   . SHOULDER SURGERY Left 2012    Social History   Social History  . Marital status: Married    Spouse name: N/A  . Number of children: 0  . Years of education: N/A    Occupational History  . Warehouse manager     Social History Main Topics  . Smoking status: Never Smoker  . Smokeless tobacco: Never Used  . Alcohol use No  . Drug use: Unknown  . Sexual activity: Not on file   Other Topics Concern  . Not on file   Social History Narrative   [place]   Household-- pt and wife     Family History  Problem Relation Age of Onset  . Prostate cancer Father 84  . CAD Father 70  . Diabetes Sister     sister and aunt  . Stroke Other     GM , late onset   . Hypertension Other     several fam members   . Colon cancer Neg Hx       Medication List    as of 01/09/2016  5:06 PM   You have not been prescribed any medications.        Objective:   Physical Exam BP 118/68 (BP Location: Left Arm, Patient Position: Sitting, Cuff Size: Normal)   Pulse (!) 59   Temp 97.6 F (36.4 C) (Oral)   Resp 14   Ht 5\' 6"  (1.676 m)   Wt 201 lb (91.2 kg)   SpO2 95%   BMI 32.44 kg/m   General:   Well developed, well nourished . NAD.  Neck: No  thyromegaly  HEENT:  Normocephalic . Face symmetric, atraumatic Lungs:  CTA B Normal respiratory effort, no intercostal retractions, no accessory muscle use. Heart: RRR,  no murmur.  No pretibial edema bilaterally  Abdomen:  Not distended, soft, non-tender. No rebound or rigidity.   Skin: Exposed areas without rash. Not pale. Not jaundice Neurologic:  alert & oriented X3.  Speech normal, gait appropriate for age and unassisted Strength symmetric and appropriate for age.  Psych: Cognition and judgment appear intact.  Cooperative with normal attention span and concentration.  Behavior appropriate. No anxious or depressed appearing.    Assessment & Plan:   Assessment> Prediabetes  ED- infrequent sx  MSK: See surgical history.  PLAN: Prediabetes: Check A1c, diet, exercise discussed. Refer to a nutritionist Right shoulder pain: saw  Ortho, he recommended PT first, patient  thought surgery was indicated and was not really satisfied with advice. Pt  [place] [facility] [place] [facility]  [place] [facility] [place].  Question of hematuria, see ROS, will check a UA, urine culture RTC one year, CPX

## 2016-01-09 NOTE — Telephone Encounter (Signed)
[facility] at [phone]. ROI sent for scanning. Awaiting records.

## 2016-01-09 NOTE — Patient Instructions (Signed)
GO TO THE LAB : Get the blood work     GO TO THE FRONT DESK Schedule your next appointment for a  Physical exam in 1 year 

## 2016-01-10 LAB — URINE CULTURE: ORGANISM ID, BACTERIA: NO GROWTH

## 2016-01-16 NOTE — Telephone Encounter (Signed)
2nd request faxed. Awaiting records.

## 2016-01-18 NOTE — Telephone Encounter (Signed)
Received medical records. Records placed in PCP red folder for review. Per note from GI, Pt was due for repeat cscope in 08/2013, they have been unable to contact Pt.

## 2016-01-19 NOTE — Telephone Encounter (Signed)
records reviewed. Saw GI 4- 17 -2012 with hematochezia, had a colonoscopy 09/14/2010, polyp present, biopsy: Sessile serratous adenoma, repeat in 3 years. Advise patient: You for a colonoscopy, refer to GI ([place] or if so desired go back to [place] in [place])

## 2016-01-19 NOTE — Telephone Encounter (Signed)
LMOM informing Pt to return call. Records abstracted and sent for scanning.    [facility]  [address] [place], [place] [address] Phone: [phone] Fax: [phone]

## 2016-01-23 ENCOUNTER — Encounter: Payer: BC Managed Care – PPO | Attending: Internal Medicine | Admitting: Dietician

## 2016-01-23 DIAGNOSIS — R739 Hyperglycemia, unspecified: Secondary | ICD-10-CM | POA: Insufficient documentation

## 2016-01-23 DIAGNOSIS — Z713 Dietary counseling and surveillance: Secondary | ICD-10-CM | POA: Diagnosis not present

## 2016-01-23 DIAGNOSIS — R7303 Prediabetes: Secondary | ICD-10-CM

## 2016-01-23 NOTE — Progress Notes (Signed)
  Medical Nutrition Therapy:  Appt start time: 0800 end time:  0910.   Assessment:  Primary concerns today: Jordan Melendez is here today since he has had prediabetes for the past few years. Has family hx of diabetes. Feels like he does not eat right and doesn't know how to eat right. Has been around the same weight for about 10 years (sometimes down to around 198 lbs). Would like to lose weight and be healthy. Has been working on not eating late at night (less often).   Works as a Warehouse managercollege administrator at [employer] and does some walking but mostly sits. Doesn't feel too much stress. Lives with his wife. States that his wife does more of the food shopping and meal preparation than he does. Does not usually eat breakfast. Tries not to eat dinner because he gets home late (around 7-8 PM). Eats out for lunch during the week (3 times) and 1-2 meals on the weekend.   Feels like he eats a lot of bread. Does not drink alcohol. Has bad shoulders and knees.   Would like to get weight down to around 180 lbs.  Preferred Learning Style:   No preference indicated   Learning Readiness:   Ready  MEDICATIONS: none   DIETARY INTAKE:  Usual eating pattern includes 1-2 meals and 0-2 snacks per day.  Avoided foods include: mayo, dressing  24-hr recall:  B ( AM): none during the week, eggs, bacon, and toast or pastries on the weekend  Snk ( AM): none  L ( PM): none during the weekend, chick fil a fried sandwich with fries or rotisserie chicken with bread Snk ( PM): candy  D ( PM): sometimes skips, barbaritos quesadilla, salad with cheese and bacon and no dressing, salmon with vegetables  Snk ( PM): poptart, cashews, cheese and crackers Beverages: hot tea with splenda, 1-3 small sprite/pepsi, water  Usual physical activity: 2 x week rides bides, walks for 60 minutes, uses weights, trying to get back to morning push ups and sit ups  Estimated energy needs: 1800 calories 200 g carbohydrates 135 g protein 50 g  fat  Progress Towards Goal(s):  In progress.   Nutritional Diagnosis:  NB-1.1 Food and nutrition-related knowledge deficit As related to hx of meal skipping and large portion sizes.  As evidenced by diet recall and Hgb A1c of 5.8%.    Intervention:  Nutrition counseling provided. Plan: Aim to eat protein and carbs together at least 3 x day. Try protein shake (Premier) and fruit, yogurt, [employer] protein bar for breakfast. Aim to fill half of your plate with vegetables at lunch and dinner. Try to choose one starch at a restaurant (for ex fries or bread). Try WellPointLean Cuisine, Phel[employer] DodgeSmart Ones, or Healthy Choice frozen lunch (you can add more vegetables). Think about bringing leftovers for work for lunch.  Have protein the size of the palm of your hand and starch the amount you can hold in your hand.  Try using a smaller plate at dinner. Take 20 minutes to eat. Have seconds if you are hungry. Cut back to one small soda a day.   Teaching Method Utilized:  Visual Auditory Hands on  Handouts given during visit include:  Living Well With Diabetes  Meal Card  MyPlated  15 g CHO Snacks  Barriers to learning/adherence to lifestyle change: none  Demonstrated degree of understanding via:  Teach Back   Monitoring/Evaluation:  Dietary intake, exercise, and body weight prn.

## 2016-01-23 NOTE — Patient Instructions (Addendum)
Aim to eat protein and carbs together at least 3 x day. Try protein shake (Premier) and fruit, yogurt, [employer] protein bar for breakfast. Aim to fill half of your plate with vegetables at lunch and dinner. Try to choose one starch at a restaurant (for ex fries or bread). Try WellPointLean Cuisine, Phel[employer] DodgeSmart Ones, or Healthy Choice frozen lunch (you can add more vegetables). Think about bringing leftovers for work for lunch.  Have protein the size of the palm of your hand and starch the amount you can hold in your hand.  Try using a smaller plate at dinner. Take 20 minutes to eat. Have seconds if you are hungry. Cut back to one small soda a day.

## 2016-09-09 ENCOUNTER — Ambulatory Visit (INDEPENDENT_AMBULATORY_CARE_PROVIDER_SITE_OTHER): Payer: BC Managed Care – PPO | Admitting: Internal Medicine

## 2016-09-09 ENCOUNTER — Encounter: Payer: Self-pay | Admitting: Internal Medicine

## 2016-09-09 VITALS — BP 110/62 | HR 65 | Temp 97.8°F | Resp 14 | Ht 66.0 in | Wt 211.0 lb

## 2016-09-09 DIAGNOSIS — N529 Male erectile dysfunction, unspecified: Secondary | ICD-10-CM | POA: Diagnosis not present

## 2016-09-09 HISTORY — DX: Male erectile dysfunction, unspecified: N52.9

## 2016-09-09 MED ORDER — SILDENAFIL CITRATE 20 MG PO TABS: 80.0000 mg | ORAL_TABLET | Freq: Every evening | ORAL | 3 refills | Status: DC | PRN

## 2016-09-09 NOTE — Progress Notes (Signed)
   Subjective:    Patient ID: Jordan Melendez, male    DOB: 12-09-1960, 56 y.o.   MRN: [id number]  DOS:  09/09/2016 Type of visit - description : ED  managment Interval history: H/o ED x 2 years, previously tried viagra, no s/e, effective? Cost was an issue  Review of Systems Denies  CP-palpitations-claudication No HAs, N-V Normal libido Stress occasionally but nothing unusual  Good marital relationship  Past Medical History:  Diagnosis Date  . H/O colonoscopy 2012   [place]    Past Surgical History:  Procedure Laterality Date  . KNEE SURGERY     x 3   . SHOULDER SURGERY Left 2012    Social History   Social History  . Marital status: Married    Spouse name: N/A  . Number of children: 0  . Years of education: N/A   Occupational History  . Warehouse managercollege administrator     Social History Main Topics  . Smoking status: Never Smoker  . Smokeless tobacco: Never Used  . Alcohol use No  . Drug use: Unknown  . Sexual activity: Not on file   Other Topics Concern  . Not on file   Social History Narrative   [place]   Household-- pt and wife      Allergies as of 09/09/2016   No Known Allergies     Medication List       Accurate as of 09/09/16  1:12 PM. Always use your most recent med list.          sildenafil 20 MG tablet Commonly known as:  REVATIO Take 4-5 tablets (80-100 mg total) by mouth at bedtime as needed.          Objective:   Physical Exam BP 110/62 (BP Location: Left Arm, Patient Position: Sitting, Cuff Size: Normal)   Pulse 65   Temp 97.8 F (36.6 C) (Oral)   Resp 14   Ht 5\' 6"  (1.676 m)   Wt 211 lb (95.7 kg)   SpO2 98%   BMI 34.06 kg/m  General:   Well developed, well nourished . NAD.  HEENT:  Normocephalic . Face symmetric, atraumatic  Neurologic:  alert & oriented X3.  Speech normal, gait appropriate for age and unassisted Psych--  Cognition and judgment appear intact.  Cooperative with normal  attention span and concentration.  Behavior appropriate. No anxious or depressed appearing.      Assessment & Plan:   Assessment> Prediabetes  ED- infrequent sx  MSK: See surgical history.  PLAN: ED: discussed mngmt options, rec retrial w/ meds, Rx sildenafil, how to use it discussed. Also counseled about creating a an appropriate environment before sexual activity

## 2016-09-09 NOTE — Patient Instructions (Signed)
Take meds as needed, once a day  [facility]

## 2016-09-09 NOTE — Assessment & Plan Note (Signed)
ED: discussed mngmt options, rec retrial w/ meds, Rx sildenafil, how to use it discussed. Also counseled about creating a an appropriate environment before sexual activity

## 2016-09-09 NOTE — Progress Notes (Signed)
Pre visit review using our clinic review tool, if applicable. No additional management support is needed unless otherwise documented below in the visit note. 

## 2017-08-09 ENCOUNTER — Other Ambulatory Visit: Payer: Self-pay | Admitting: Internal Medicine

## 2017-08-21 ENCOUNTER — Telehealth: Payer: Self-pay

## 2017-08-21 MED ORDER — ALPRAZOLAM 0.5 MG PO TABS: ORAL_TABLET | ORAL | 0 refills | Status: DC

## 2017-08-21 NOTE — Telephone Encounter (Signed)
Copied from CRM [id number]. Topic: Quick Communication - See Telephone Encounter >> Aug 20, 2017  3:55 PM Herby AbrahamJohnson, Shiquita C wrote: CRM for notification. See Telephone encounter for: 08/20/17.  Pt is requesting a Rx for anxiety. Pt says that he is taking a long flight and need something to help. Please advise.  Pharmacy: [facility] [address] [place] - [id number] [phone] (Phone) [phone] (Fax)

## 2017-08-21 NOTE — Addendum Note (Signed)
Addended by: Willow OraPAZ, Cassidy Tashiro E on: 08/21/2017 11:45 AM   Modules accepted: Orders

## 2017-08-21 NOTE — Telephone Encounter (Signed)
Left message to return call. Ok for pec to discuss.  

## 2017-08-21 NOTE — Telephone Encounter (Addendum)
Call pt, let him know: will send a small amount of Xanax, follow the instructions in the bottle.  Please make him aware that even a small dose of Xanax may cause him to be sleepy for several hours, recommend to try it before he takes the airplane. Also, incidentally, he is due for physical exam.

## 2017-12-08 ENCOUNTER — Encounter: Payer: Self-pay | Admitting: Internal Medicine

## 2017-12-08 ENCOUNTER — Ambulatory Visit: Payer: Self-pay | Admitting: Internal Medicine

## 2017-12-08 VITALS — Ht 66.0 in

## 2017-12-08 NOTE — Progress Notes (Deleted)
Pre visit review using our clinic review tool, if applicable. No additional management support is needed unless otherwise documented below in the visit note. 

## 2017-12-08 NOTE — Progress Notes (Signed)
Not seen today d/t inactive insurance.

## 2018-01-02 ENCOUNTER — Encounter: Payer: Self-pay | Admitting: Internal Medicine

## 2018-01-02 ENCOUNTER — Ambulatory Visit: Payer: BC Managed Care – PPO | Admitting: Internal Medicine

## 2018-01-02 VITALS — BP 124/70 | HR 63 | Temp 98.3°F | Resp 14 | Ht 66.0 in | Wt 215.2 lb

## 2018-01-02 DIAGNOSIS — F419 Anxiety disorder, unspecified: Secondary | ICD-10-CM | POA: Diagnosis not present

## 2018-01-02 DIAGNOSIS — M7711 Lateral epicondylitis, right elbow: Secondary | ICD-10-CM

## 2018-01-02 MED ORDER — ALPRAZOLAM 0.5 MG PO TABS: ORAL_TABLET | ORAL | 0 refills | Status: DC

## 2018-01-02 MED ORDER — SILDENAFIL CITRATE 20 MG PO TABS: ORAL_TABLET | ORAL | 1 refills | Status: DC

## 2018-01-02 NOTE — Progress Notes (Signed)
Subjective:    Patient ID: Jordan Melendez, male    DOB: [birth year], 57 y.o.   MRN: [id number]  DOS:  01/02/2018 Type of visit - description : acute Interval history: 3 years ago after he did some heavy blowing in his yard developed pain on the right lateral epicondyle. Since then he is having on and off symptoms, more so in the last 2 to 3 months particularly when he squeezes something with his right hand.  It feels like a sharp pain.   Review of Systems Denies any recent injury. Elbow is not red, swollen or hot.   Past Medical History:  Diagnosis Date  . Erectile dysfunction 09/09/2016  . H/O colonoscopy 2012   [name] , [place]    Past Surgical History:  Procedure Laterality Date  . KNEE SURGERY     x 3   . SHOULDER SURGERY Left 2012    Social History   Socioeconomic History  . Marital status: Married    Spouse name: Not on file  . Number of children: 0  . Years of education: Not on file  . Highest education level: Not on file  Occupational History  . Occupation: Warehouse manager   Social Needs  . Financial resource strain: Not on file  . Food insecurity:    Worry: Not on file    Inability: Not on file  . Transportation needs:    Medical: Not on file    Non-medical: Not on file  Tobacco Use  . Smoking status: Never Smoker  . Smokeless tobacco: Never Used  Substance and Sexual Activity  . Alcohol use: No  . Drug use: Not on file  . Sexual activity: Not on file  Lifestyle  . Physical activity:    Days per week: Not on file    Minutes per session: Not on file  . Stress: Not on file  Relationships  . Social connections:    Talks on phone: Not on file    Gets together: Not on file    Attends religious service: Not on file    Active member of club or organization: Not on file    Attends meetings of clubs or organizations: Not on file    Relationship status: Not on file  . Intimate partner violence:    Fear of current or ex partner: Not on file   Emotionally abused: Not on file    Physically abused: Not on file    Forced sexual activity: Not on file  Other Topics Concern  . Not on file  Social History Narrative   [place]   Household-- pt and wife      Allergies as of 01/02/2018   No Known Allergies     Medication List        Accurate as of 01/02/18 10:39 AM. Always use your most recent med list.          ALPRAZolam 0.5 MG tablet Commonly known as:  XANAX Half or 1 tablet once daily as needed for anxiety when taking a airplane   sildenafil 20 MG tablet Commonly known as:  REVATIO TAKE 4-5 TABS BY MOUTH AT BEDTIME AS NEEDED          Objective:   Physical Exam BP 124/70 (BP Location: Left Arm, Patient Position: Sitting, Cuff Size: Small)   Pulse 63   Temp 98.3 F (36.8 C) (Oral)   Resp 14   Ht 5\' 6"  (1.676 m)   Wt 215 lb  4 oz (97.6 kg)   SpO2 96%   BMI 34.74 kg/m  General:   Well developed, NAD, see BMI.  HEENT:  Normocephalic . Face symmetric, atraumatic MSK: Left elbow normal Right elbow: Normal to inspection and palpation,  lateral epicondyle slightly TTP but otherwise normal. Skin: Not pale. Not jaundice Neurologic:  alert & oriented X3.  Speech normal, gait appropriate for age and unassisted Psych--  Cognition and judgment appear intact.  Cooperative with normal attention span and concentration.  Behavior appropriate. No anxious or depressed appearing.      Assessment & Plan:  Assessment> Prediabetes  ED- infrequent sx  MSK: See surgical history.  PLAN:  Tennis elbow: Right-sided, recommend conservative treatment with the sporadic use of ibuprofen if pain, tennis elbow brace and ice nightly.  If not better he is to call me, sports med referral?. Anxiety d/t  flights: RF Xanax ED: RF Viagra Rec flu shot, states we will get at his pharmacy. Recommend a CPX, see family history, states he will make the appointment today.

## 2018-01-02 NOTE — Progress Notes (Signed)
Pre visit review using our clinic review tool, if applicable. No additional management support is needed unless otherwise documented below in the visit note. 

## 2018-01-02 NOTE — Patient Instructions (Signed)
  GO TO THE FRONT DESK Schedule your next appointment for a  Physical exam, fasting at your earliest convenience    ICE every night  Tennis elbow brace  Take IBUPROFEN (Advil or Motrin) 200 mg 2 tablets every 12 hours as needed for pain.  Always take it with food because may cause gastritis and ulcers.  If you notice nausea, stomach pain, change in the color of stools --->  Stop the medicine and let us know

## 2018-01-03 DIAGNOSIS — F419 Anxiety disorder, unspecified: Secondary | ICD-10-CM | POA: Insufficient documentation

## 2018-01-03 NOTE — Assessment & Plan Note (Addendum)
Tennis elbow: Right-sided, recommend conservative treatment with the sporadic use of ibuprofen if pain, tennis elbow brace and ice nightly.  If not better he is to call me, sports med referral?. Anxiety d/t  flights: RF Xanax ED: RF Viagra Rec flu shot, states we will get at his pharmacy. Recommend a CPX, see family history, states he will make the appointment today.

## 2018-01-05 ENCOUNTER — Other Ambulatory Visit: Payer: Self-pay | Admitting: Internal Medicine

## 2018-01-29 ENCOUNTER — Ambulatory Visit (HOSPITAL_BASED_OUTPATIENT_CLINIC_OR_DEPARTMENT_OTHER)
Admission: RE | Admit: 2018-01-29 | Discharge: 2018-01-29 | Disposition: A | Discharge status: Home / Self Care | Payer: BC Managed Care – PPO | Source: Ambulatory Visit | Attending: Family Medicine | Admitting: Family Medicine

## 2018-01-29 ENCOUNTER — Encounter (HOSPITAL_BASED_OUTPATIENT_CLINIC_OR_DEPARTMENT_OTHER): Payer: Self-pay

## 2018-01-29 ENCOUNTER — Encounter: Payer: Self-pay | Admitting: Family Medicine

## 2018-01-29 ENCOUNTER — Ambulatory Visit: Payer: BC Managed Care – PPO | Admitting: Family Medicine

## 2018-01-29 ENCOUNTER — Ambulatory Visit: Payer: Self-pay

## 2018-01-29 VITALS — BP 158/70 | HR 69 | Resp 18 | Ht 66.0 in | Wt 220.0 lb

## 2018-01-29 DIAGNOSIS — W100XXA Fall (on)(from) escalator, initial encounter: Secondary | ICD-10-CM

## 2018-01-29 DIAGNOSIS — R1012 Left upper quadrant pain: Secondary | ICD-10-CM

## 2018-01-29 DIAGNOSIS — S3991XA Unspecified injury of abdomen, initial encounter: Secondary | ICD-10-CM | POA: Diagnosis not present

## 2018-01-29 LAB — CBC
HCT: 43.7 % (ref 39.0–52.0)
HEMOGLOBIN: 14.4 g/dL (ref 13.0–17.0)
MCHC: 33 g/dL (ref 30.0–36.0)
MCV: 88.3 fl (ref 78.0–100.0)
Platelets: 211 10*3/uL (ref 150.0–400.0)
RBC: 4.95 Mil/uL (ref 4.22–5.81)
RDW: 13.5 % (ref 11.5–15.5)
WBC: 6.7 10*3/uL (ref 4.0–10.5)

## 2018-01-29 LAB — COMPREHENSIVE METABOLIC PANEL
ALBUMIN: 4.5 g/dL (ref 3.5–5.2)
ALT: 29 U/L (ref 0–53)
AST: 21 U/L (ref 0–37)
Alkaline Phosphatase: 78 U/L (ref 39–117)
BILIRUBIN TOTAL: 0.4 mg/dL (ref 0.2–1.2)
BUN: 12 mg/dL (ref 6–23)
CALCIUM: 9.3 mg/dL (ref 8.4–10.5)
CHLORIDE: 104 meq/L (ref 96–112)
CO2: 33 meq/L — AB (ref 19–32)
Creatinine, Ser: 1.03 mg/dL (ref 0.40–1.50)
GFR: 95.93 mL/min (ref >60.00)
Glucose, Bld: 97 mg/dL (ref 70–99)
Potassium: 3.8 mEq/L (ref 3.5–5.1)
Sodium: 142 mEq/L (ref 135–145)
Total Protein: 7.6 g/dL (ref 6.0–8.3)

## 2018-01-29 MED ORDER — IOPAMIDOL (ISOVUE-300) INJECTION 61%
100.0000 mL | Freq: Once | INTRAVENOUS | Status: AC | PRN
  Administered 2018-01-29: 100 mL via INTRAVENOUS

## 2018-01-29 MED ORDER — HYDROCODONE-ACETAMINOPHEN 5-325 MG PO TABS: 1.0000 | ORAL_TABLET | Freq: Three times a day (TID) | ORAL | 0 refills | Status: AC | PRN

## 2018-01-29 NOTE — Telephone Encounter (Signed)
Pt call experiencing pain after a fall on the escalator in the airport last [place]. Pt states his pain is under his ribs left side below his breast. He states the pain made it difficult to get out of bed this AM. Having a bowel movement causes pain. He rates pain at 9. He states he had an abrasion 3 scratches to his back that are about 2.5 inches long.  Last Tdap 2015 per chart review. Pt states the rib injury dose not seem to affect his breathing. He denies hitting his head. Appointment scheduled per protocol. Care advice read to patient.  Pt verbalized understanding of all instructions.  Reason for Disposition . Large swelling or bruise > 2 inches (5 cm)  Answer Assessment - Initial Assessment Questions 1. MECHANISM: "How did the injury happen?"     Last night on esclator 2. ONSET: "When did the injury happen?" (Minutes or hours ago)     Last night 3. LOCATION: "Where on the chest is the injury located?"     Left rib area waist below breast 4. APPEARANCE: "What does the injury look like?"     Scratch to back 5. BLEEDING: "Is there any bleeding now? If so, ask: How long has it been bleeding?"     no 6. SEVERITY: "Any difficulty with breathing?"     No unable to BM because it causes pain 7. SIZE: For cuts, bruises, or swelling, ask: "How large is it?" (e.g., inches or centimeters)     2.5 inches of abrasion 8. PAIN: "Is there pain?" If so, ask: "How bad is the pain?"   (e.g., Scale 1-10; or mild, moderate, severe)     9 9. TETANUS: For any breaks in the skin, ask: "When was the last tetanus booster?"     Unsure maybe 2 years ago 10. PREGNANCY: "Is there any chance you are pregnant?" "When was your last menstrual period?"       N/A  Protocols used: CHEST INJURY-A-AH

## 2018-01-29 NOTE — Telephone Encounter (Signed)
FYI

## 2018-01-29 NOTE — Telephone Encounter (Signed)
Noted  

## 2018-01-29 NOTE — Progress Notes (Signed)
[facility] [address], [address] [place], [place] [address] [phone]  Date:  01/29/2018   Name:  Jordan Melendez   DOB:  01/27/1961   MRN:  [id number]  PCP:  Wanda Plump, MD    Chief Complaint: Fall (last night, left side)   History of Present Illness:  Jordan Melendez is a 57 y.o. very pleasant male patient who presents with the following:  Here today following a fall on an escalator last night- [place] and [place] in a rush, pulling his luggage. A bag got caught and he had to try and go up the down way and ended up falling He hit his left side on something- ?the stair or the railing. It happened fast so he is not sure  He has pain in his left side/ upper abdomen - last night this was so severe he was unable to get out of the car or out of bed without help, so he got worried and decided to come in  He used a heating pad which did help some  Moving is very painful Any cough or sneeze is painful He also got scapted up on his left shoulder blade He did not hit his head   He is not vomiting He is able to eat ok  Tetanus is UTD  He is generally in good health otherwise   Patient Active Problem List   Diagnosis Date Noted  . Anxiety 01/03/2018  . Erectile dysfunction 09/09/2016  . PCP NOTES >>> 01/04/2015  . Foot pain 04/12/2014  . Annual physical exam 12/20/2013  . IBS (irritable bowel syndrome) 12/20/2013    Past Medical History:  Diagnosis Date  . Erectile dysfunction 09/09/2016  . H/O colonoscopy 2012   [name] , [place]    Past Surgical History:  Procedure Laterality Date  . KNEE SURGERY     x 3   . SHOULDER SURGERY Left 2012    Social History   Tobacco Use  . Smoking status: Never Smoker  . Smokeless tobacco: Never Used  Substance Use Topics  . Alcohol use: No  . Drug use: Not on file    Family History  Problem Relation Age of Onset  . Prostate cancer Father 89  . CAD Father 98   . Diabetes Sister        sister and aunt  . Stroke Other        GM , late onset   . Hypertension Other        several fam members   . Colon cancer Neg Hx     No Known Allergies  Medication list has been reviewed and updated.  Current Outpatient Medications on File Prior to Visit  Medication Sig Dispense Refill  . sildenafil (REVATIO) 20 MG tablet TAKE 4-5 TABLETS BY MOUTH AT BEDTIME AS NEEDED 90 tablet 0   No current facility-administered medications on file prior to visit.     Review of Systems:  As per HPI- otherwise negative. No difficulty breathing Reviewed NCCSR- all ok    Physical Examination: Vitals:   01/29/18 1422  BP: (!) 158/70  Pulse: 69  Resp: 18  SpO2: 100%   Vitals:   01/29/18 1422  Weight: 220 lb (99.8 kg)  Height: 5\' 6"  (1.676 m)   Body mass index is 35.51 kg/m. Ideal Body Weight: Weight in (lb) to have BMI = 25: 154.6  GEN: WDWN, NAD, Non-toxic,  A & O x 3, obese, looks well  HEENT: Atraumatic, Normocephalic. Neck supple. No masses, No LAD.  Bilateral TM wnl, oropharynx normal.  PEERL,EOMI.   Ears and Nose: No external deformity. CV: RRR, No M/G/R. No JVD. No thrill. No extra heart sounds. PULM: CTA B, no wheezes, crackles, rhonchi. No retractions. No resp. distress. No accessory muscle use. ABD: S,  ND, +BS. No rebound. No HSM.  He has mild tenderness over the left upper abdomen, ribs seem to be be ok His abdominal exam is not impressive today No tenderness over the ribs No redness or swelling Left posterior shoulder displays clear abrasion marks from the teeth of the escalator stair EXTR: No c/c/e NEURO Normal gait.  PSYCH: Normally interactive. Conversant. Not depressed or anxious appearing.  Calm demeanor.    Assessment and Plan: Left upper quadrant pain - Plan: CBC, Comprehensive metabolic panel, DG Ribs Unilateral W/Chest Left, HYDROcodone-acetaminophen (NORCO/VICODIN) 5-325 MG tablet, CANCELED: CT Abdomen Pelvis W Contrast  Fall  on escalator, initial encounter - Plan: CT Abdomen Pelvis W Contrast  Blunt trauma to abdomen, initial encounter - Plan: CT Abdomen Pelvis W Contrast  Here today following a fall on an escalator yesterday resulting in blunt trauma to his abdomen.  At this time his exam is unimpressive but he describes quite severe pain light night concerning for internal injury.  After discussion with pt decided to pursue CT abd/pelvis and plain films of his ribs  Rib films showed possible free air.  Called and discussed with Dr. Allyson Sabal who advised me that Ct scan would take the place of doing a left lat decub film.    Obtained CT and called to discuss with pt. Good news, CT is benign.  He will rest, use pain medication rx if needed He is advised to seek care right away if any severe pain or worsening and he agrees.  Otherwise suspect he will get better over the next few days    Results for orders placed or performed in visit on 01/29/18  CBC  Result Value Ref Range   WBC 6.7 4.0 - 10.5 K/uL   RBC 4.95 4.22 - 5.81 Mil/uL   Platelets 211.0 150.0 - 400.0 K/uL   Hemoglobin 14.4 13.0 - 17.0 g/dL   HCT 40.9 81.1 - 91.4 %   MCV 88.3 78.0 - 100.0 fl   MCHC 33.0 30.0 - 36.0 g/dL   RDW 78.2 95.6 - 21.3 %  Comprehensive metabolic panel  Result Value Ref Range   Sodium 142 135 - 145 mEq/L   Potassium 3.8 3.5 - 5.1 mEq/L   Chloride 104 96 - 112 mEq/L   CO2 33 (H) 19 - 32 mEq/L   Glucose, Bld 97 70 - 99 mg/dL   BUN 12 6 - 23 mg/dL   Creatinine, Ser 0.86 0.40 - 1.50 mg/dL   Total Bilirubin 0.4 0.2 - 1.2 mg/dL   Alkaline Phosphatase 78 39 - 117 U/L   AST 21 0 - 37 U/L   ALT 29 0 - 53 U/L   Total Protein 7.6 6.0 - 8.3 g/dL   Albumin 4.5 3.5 - 5.2 g/dL   Calcium 9.3 8.4 - 57.8 mg/dL   GFR 46.96 >29.52 mL/min    Signed Abbe Amsterdam, MD  Dg Ribs Unilateral W/chest Left  Addendum Date: 01/29/2018   ADDENDUM REPORT: 01/29/2018 16:16 ADDENDUM: I spoke with Dr. Patsy Lager. A CT of the abdomen pelvis has been  ordered, and therefore the left lateral decubitus of the abdomen  is not indicated. That area will be well evaluated by the pending CT. Electronically Signed   By: Dwyane DeePaul  Barry M.D.   On: 01/29/2018 16:16   Result Date: 01/29/2018 CLINICAL DATA:  Pain in the left upper abdomen after a fall EXAM: LEFT RIBS AND CHEST - 3+ VIEW COMPARISON:  None. FINDINGS: The lungs appear clear. No pneumonia is seen and no pleural effusion is noted. There is no evidence of pneumothorax. However, on the frontal view there is suspicion of possible free intraperitoneal air. This is not confirmed on the erect view of the abdomen with the lateral aspect of the right hemidiaphragm not included on the view. Recommend left decubitus centered over the liver to assess for possible small amount of free air. This may simply represent artifactual lucency. Left rib detail films show no acute fracture of the left lower ribs. IMPRESSION: 1. Negative left rib detail. 2. Lucency under the right hemidiaphragm on the frontal view may be artifactual, but a small amount of free air cannot be excluded. Recommend left side down decubitus centered over the liver to assess for free air. 3. No active lung disease.  No pneumothorax. Electronically Signed: By: Dwyane DeePaul  Barry M.D. On: 01/29/2018 15:52   Ct Abdomen Pelvis W Contrast  Result Date: 01/29/2018 CLINICAL DATA:  57 year old and recent fall.  Left abdominal pain. EXAM: CT ABDOMEN AND PELVIS WITH CONTRAST TECHNIQUE: Multidetector CT imaging of the abdomen and pelvis was performed using the standard protocol following bolus administration of intravenous contrast. CONTRAST:  100mL ISOVUE-300 IOPAMIDOL (ISOVUE-300) INJECTION 61% COMPARISON:  None. FINDINGS: Lower chest: Lung bases are clear.  No pleural effusions. Hepatobiliary: 1.4 cm low-density structure in the right hepatic lobe is suggestive for a cyst. Liver is slightly decreased attenuation. Portal venous system is patent. Normal appearance of the  gallbladder. No biliary dilatation. Pancreas: Unremarkable. No pancreatic ductal dilatation or surrounding inflammatory changes. Spleen: Normal in size without focal abnormality. Adrenals/Urinary Tract: Adrenal glands are unremarkable. Kidneys are normal, without renal calculi, or hydronephrosis. Question a tiny cyst in the mid left kidney. Bladder is unremarkable. Stomach/Bowel: Large amount of stool in the rectum. Oral contrast in small bowel and large bowel. No evidence for bowel obstruction or inflammation. Normal appendix. Stomach is unremarkable. Vascular/Lymphatic: No significant vascular findings are present. No enlarged abdominal or pelvic lymph nodes. Reproductive: Few calcifications in the prostate. Prostate is mildly enlarged measuring 5.3 cm in transverse dimension. Other: No free fluid. Negative for free air. Small umbilical hernia containing fat. Musculoskeletal: Irregularity involving the left pubic bone appears chronic. No acute bone abnormality in the abdomen or pelvis. IMPRESSION: 1. No acute abnormality in the abdomen or pelvis. 2. Rectum is distended with a large amount of stool. 3. Small umbilical hernia containing fat. 4. Mild prostate enlargement. Electronically Signed   By: Richarda OverlieAdam  Henn M.D.   On: 01/29/2018 18:28

## 2018-02-23 ENCOUNTER — Encounter: Payer: BC Managed Care – PPO | Admitting: Internal Medicine

## 2018-02-24 ENCOUNTER — Ambulatory Visit (INDEPENDENT_AMBULATORY_CARE_PROVIDER_SITE_OTHER): Payer: BC Managed Care – PPO | Admitting: Internal Medicine

## 2018-02-24 ENCOUNTER — Encounter: Payer: Self-pay | Admitting: Internal Medicine

## 2018-02-24 VITALS — BP 124/62 | HR 69 | Temp 98.2°F | Resp 16 | Ht 66.0 in | Wt 213.1 lb

## 2018-02-24 DIAGNOSIS — Z6834 Body mass index (BMI) 34.0-34.9, adult: Secondary | ICD-10-CM

## 2018-02-24 DIAGNOSIS — E669 Obesity, unspecified: Secondary | ICD-10-CM

## 2018-02-24 DIAGNOSIS — Z23 Encounter for immunization: Secondary | ICD-10-CM

## 2018-02-24 DIAGNOSIS — Z1211 Encounter for screening for malignant neoplasm of colon: Secondary | ICD-10-CM

## 2018-02-24 DIAGNOSIS — R7303 Prediabetes: Secondary | ICD-10-CM | POA: Diagnosis not present

## 2018-02-24 DIAGNOSIS — Z0000 Encounter for general adult medical examination without abnormal findings: Secondary | ICD-10-CM

## 2018-02-24 DIAGNOSIS — R399 Unspecified symptoms and signs involving the genitourinary system: Secondary | ICD-10-CM

## 2018-02-24 LAB — URINALYSIS, ROUTINE W REFLEX MICROSCOPIC
Bilirubin Urine: NEGATIVE
Hgb urine dipstick: NEGATIVE
KETONES UR: NEGATIVE
LEUKOCYTES UA: NEGATIVE
Nitrite: NEGATIVE
PH: 7 (ref 5.0–8.0)
RBC / HPF: NONE SEEN (ref 0–?)
SPECIFIC GRAVITY, URINE: 1.015 (ref 1.000–1.030)
TOTAL PROTEIN, URINE-UPE24: NEGATIVE
UROBILINOGEN UA: 1 (ref 0.0–1.0)
Urine Glucose: NEGATIVE

## 2018-02-24 LAB — LIPID PANEL
CHOLESTEROL: 165 mg/dL (ref 0–200)
HDL: 39.1 mg/dL (ref >39.00)
LDL Cholesterol: 107 mg/dL — ABNORMAL HIGH (ref 0–99)
NonHDL: 125.64
TRIGLYCERIDES: 93 mg/dL (ref 0.0–149.0)
Total CHOL/HDL Ratio: 4
VLDL: 18.6 mg/dL (ref 0.0–40.0)

## 2018-02-24 LAB — HEMOGLOBIN A1C: HEMOGLOBIN A1C: 6 % (ref 4.6–6.5)

## 2018-02-24 LAB — PSA: PSA: 3.47 ng/mL (ref 0.10–4.00)

## 2018-02-24 LAB — TSH: TSH: 0.68 u[IU]/mL (ref 0.35–4.50)

## 2018-02-24 NOTE — Progress Notes (Signed)
Pre visit review using our clinic review tool, if applicable. No additional management support is needed unless otherwise documented below in the visit note. 

## 2018-02-24 NOTE — Patient Instructions (Addendum)
GO TO THE LAB : Get the blood work     GO TO THE FRONT DESK Schedule your next appointment for a physical exam in 1 year (sooner depending on labs)

## 2018-02-24 NOTE — Assessment & Plan Note (Addendum)
Tdap 2015;   flu shot  today +FH DM,CAD, prostate ca  --CCS:  colonoscopy in 2012, see report, sessile serrated adenoma, was due 2015, GI referral sent  --DRE wnl, check a PSA.  Occasional LUTS, chronically stable, checking labs. -Diet exercise discussed  , refer to a nutritionist --labs : FLP, A1c, TSH, PSA, UA, urine culture.

## 2018-02-24 NOTE — Progress Notes (Signed)
Subjective:    Patient ID: Jordan Melendez, male    DOB: 03-09-1961, 57 y.o.   MRN: [id number]  DOS:  02/24/2018 Type of visit - description : cpx Here for CPX   Review of Systems Recently seen with abdominal pain after a fall, work-up was negative Tennis elbow: Has used a brace sporadically, pain slightly better. Has chronic LUTS: Occasional nocturia for more than 30 years, episodic urinary frequency for a day or 2. Denies dysuria, difficulty urinating or gross hematuria. He also noted lack of focus and occasionally he is forgetful.  Other than above, a 14 point review of systems is negative    Past Medical History:  Diagnosis Date  . Erectile dysfunction 09/09/2016  . [place] 2012   [place]    Past Surgical History:  Procedure Laterality Date  . KNEE SURGERY     x 3   . SHOULDER SURGERY Left 2012    Social History   Socioeconomic History  . Marital status: Married    Spouse name: Not on file  . Number of children: 0  . Years of education: Not on file  . Highest education level: Not on file  Occupational History  . Occupation: Warehouse managercollege administrator   Social Needs  . Financial resource strain: Not on file  . Food insecurity:    Worry: Not on file    Inability: Not on file  . Transportation needs:    Medical: Not on file    Non-medical: Not on file  Tobacco Use  . Smoking status: Never Smoker  . Smokeless tobacco: Never Used  Substance and Sexual Activity  . Alcohol use: No  . Drug use: Not on file  . Sexual activity: Not on file  Lifestyle  . Physical activity:    Days per week: Not on file    Minutes per session: Not on file  . Stress: Not on file  Relationships  . Social connections:    Talks on phone: Not on file    Gets together: Not on file    Attends religious service: Not on file    Active member of club or organization: Not on file    Attends meetings of clubs or organizations: Not on file    Relationship status: Not on  file  . Intimate partner violence:    Fear of current or ex partner: Not on file    Emotionally abused: Not on file    Physically abused: Not on file    Forced sexual activity: Not on file  Other Topics Concern  . Not on file  Social History Narrative   [place]oved from [place]   Household-- pt and wife     Family History  Problem Relation Age of Onset  . Prostate cancer Father 3960  . CAD Father 3159  . Diabetes Sister        sister and aunt  . Stroke Other        GM , late onset   . Hypertension Other        several fam members   . Colon cancer Neg Hx      Allergies as of 02/24/2018   No Known Allergies     Medication List        Accurate as of 02/24/18 11:59 PM. Always use your most recent med list.          sildenafil 20 MG tablet Commonly known as:  REVATIO TAKE 4-5 TABLETS BY MOUTH  AT BEDTIME AS NEEDED           Objective:   Physical Exam BP 124/62 (BP Location: Left Arm, Patient Position: Sitting, Cuff Size: Normal)   Pulse 69   Temp 98.2 F (36.8 C) (Oral)   Resp 16   Ht 5\' 6"  (1.676 m)   Wt 213 lb 2 oz (96.7 kg)   SpO2 98%   BMI 34.40 kg/m  General: Well developed, NAD, BMI noted Neck: No  thyromegaly  HEENT:  Normocephalic . Face symmetric, atraumatic Lungs:  CTA B Normal respiratory effort, no intercostal retractions, no accessory muscle use. Heart: RRR,  no murmur.  No pretibial edema bilaterally  Abdomen:  Not distended, soft, non-tender. No rebound or rigidity.  Rectal: External abnormalities: none. Normal sphincter tone. No rectal masses or tenderness.  Brown stools Prostate: Prostate gland firm and smooth, no enlargement, nodularity, tenderness, mass, asymmetry or induration Skin: Exposed areas without rash. Not pale. Not jaundice Neurologic:  alert & oriented X3.  Speech normal, gait appropriate for age and unassisted Strength symmetric and appropriate for age.  Psych: Cognition and judgment appear intact.    Cooperative with normal attention span and concentration.  Behavior appropriate. No anxious or depressed appearing.     Assessment & Plan:    Assessment Prediabetes (a1c= 6.0 2015) ED- infrequent sx  MSK: See surgical history.  PLAN: Prediabetes: Check A1c, request a nutritionist referral. Tennis elbow: Uses brace infrequently, slightly better, plan to use the brace consistently and call if not better Decreased focus, forgetfulness: Tips provided, needs to manage this time better and organize himself.  If further assessment is needed, he can see a psychologist or psychiatrist.  List provided. RTC 1 year, sooner depending on labs.

## 2018-02-25 LAB — URINE CULTURE
MICRO NUMBER:: 91477315
Result:: NO GROWTH
SPECIMEN QUALITY: ADEQUATE

## 2018-02-25 NOTE — Assessment & Plan Note (Signed)
Prediabetes: Check A1c, request a nutritionist referral. Tennis elbow: Uses brace infrequently, slightly better, plan to use the brace consistently and call if not better Decreased focus, forgetfulness: Tips provided, needs to manage this time better and organize himself.  If further assessment is needed, he can see a psychologist or psychiatrist.  List provided. RTC 1 year, sooner depending on labs.

## 2018-03-26 ENCOUNTER — Ambulatory Visit: Payer: BC Managed Care – PPO | Admitting: Internal Medicine

## 2018-03-26 ENCOUNTER — Telehealth: Payer: Self-pay

## 2018-03-26 ENCOUNTER — Encounter: Payer: Self-pay | Admitting: Internal Medicine

## 2018-03-26 VITALS — BP 122/84 | HR 61 | Temp 98.5°F | Resp 16 | Ht 66.0 in | Wt 213.1 lb

## 2018-03-26 DIAGNOSIS — M7711 Lateral epicondylitis, right elbow: Secondary | ICD-10-CM

## 2018-03-26 MED ORDER — DICLOFENAC SODIUM 1 % TD GEL: 4.0000 g | Freq: Four times a day (QID) | TRANSDERMAL | 3 refills | Status: AC | PRN

## 2018-03-26 NOTE — Telephone Encounter (Addendum)
PA approved. Effective 03/26/2018 to 03/26/2021.

## 2018-03-26 NOTE — Progress Notes (Signed)
Pre visit review using our clinic review tool, if applicable. No additional management support is needed unless otherwise documented below in the visit note. 

## 2018-03-26 NOTE — Assessment & Plan Note (Signed)
Tennis elbow, right side: Persistent symptoms despite conservative treatment, no h/o  overuse. Refer to Dr. Amanda Pea for further evaluation. In the meantime, will try Voltaren gel, continue icing the area at night and use the brace when he does yardwork.

## 2018-03-26 NOTE — Telephone Encounter (Signed)
PA initiated via Covermymeds; KEY: [id number]. Awaiting determination.

## 2018-03-26 NOTE — Progress Notes (Signed)
Subjective:    Patient ID: Jordan Melendez, male    DOB: Apr 05, 1960, 58 y.o.   MRN: [id number]  DOS:  03/26/2018 Type of visit - description: Acute See last visit, was diagnosed with tennis elbow, has used elbow brace and ice on and off. Still having episodes of severe sharp pain at the same location (R lateral epicondyle).   Review of Systems Denies any overuse, he does have some yard work from time to time. No swelling in the area No paresthesias  Past Medical History:  Diagnosis Date  . Erectile dysfunction 09/09/2016  . H/O colonoscopy 2012   [name] , [place]    Past Surgical History:  Procedure Laterality Date  . KNEE SURGERY     x 3   . SHOULDER SURGERY Left 2012    Social History   Socioeconomic History  . Marital status: Married    Spouse name: Not on file  . Number of children: 0  . Years of education: Not on file  . Highest education level: Not on file  Occupational History  . Occupation: Warehouse manager   Social Needs  . Financial resource strain: Not on file  . Food insecurity:    Worry: Not on file    Inability: Not on file  . Transportation needs:    Medical: Not on file    Non-medical: Not on file  Tobacco Use  . Smoking status: Never Smoker  . Smokeless tobacco: Never Used  Substance and Sexual Activity  . Alcohol use: No  . Drug use: Not on file  . Sexual activity: Not on file  Lifestyle  . Physical activity:    Days per week: Not on file    Minutes per session: Not on file  . Stress: Not on file  Relationships  . Social connections:    Talks on phone: Not on file    Gets together: Not on file    Attends religious service: Not on file    Active member of club or organization: Not on file    Attends meetings of clubs or organizations: Not on file    Relationship status: Not on file  . Intimate partner violence:    Fear of current or ex partner: Not on file    Emotionally abused: Not on file    Physically abused: Not on file   Forced sexual activity: Not on file  Other Topics Concern  . Not on file  Social History Narrative   Moved from [place]   Household-- pt and wife      Allergies as of 03/26/2018   No Known Allergies     Medication List       Accurate as of March 26, 2018 10:20 PM. Always use your most recent med list.        diclofenac sodium 1 % Gel Commonly known as:  VOLTAREN Apply 4 g topically 4 (four) times daily as needed.   sildenafil 20 MG tablet Commonly known as:  REVATIO TAKE 4-5 TABLETS BY MOUTH AT BEDTIME AS NEEDED           Objective:   Physical Exam BP 122/84 (BP Location: Left Arm, Patient Position: Sitting, Cuff Size: Normal)   Pulse 61   Temp 98.5 F (36.9 C) (Oral)   Resp 16   Ht 5\' 6"  (1.676 m)   Wt 213 lb 2 oz (96.7 kg)   SpO2 98%   BMI 34.40 kg/m  General:   Well developed, NAD,  BMI noted. HEENT:  Normocephalic . Face symmetric, atraumatic MSK: Right elbow: Essentially normal to inspection and palpation except for minimal tenderness of the lateral epicondylitis. Neurologic:  alert & oriented X3.  Speech normal, gait appropriate for age and unassisted Psych--  Cognition and judgment appear intact.  Cooperative with normal attention span and concentration.  Behavior appropriate. No anxious or depressed appearing.      Assessment    Assessment Prediabetes (a1c= 6.0 2015) ED- infrequent sx  MSK: See surgical history.  PLAN: Tennis elbow, right side: Persistent symptoms despite conservative treatment, no h/o  overuse. Refer to Dr. Amanda PeaGramig for further evaluation. In the meantime, will try Voltaren gel, continue icing the area at night and use the brace when he does yardwork.

## 2018-03-26 NOTE — Patient Instructions (Signed)
Will refer you to a specialist, call if you don't here from them in few days

## 2018-04-03 ENCOUNTER — Encounter: Payer: Self-pay | Admitting: Internal Medicine

## 2019-01-26 ENCOUNTER — Telehealth: Payer: Self-pay | Admitting: Internal Medicine

## 2019-01-26 MED ORDER — SILDENAFIL CITRATE 20 MG PO TABS: ORAL_TABLET | ORAL | 0 refills | Status: DC

## 2019-01-26 MED ORDER — ALPRAZOLAM 0.5 MG PO TABS: ORAL_TABLET | ORAL | 0 refills | Status: DC

## 2019-01-26 NOTE — Telephone Encounter (Signed)
Rx sent Advise patient, he does need a CPX by December 2020.

## 2019-01-26 NOTE — Telephone Encounter (Signed)
Needs CPX 02/2019 please.

## 2019-01-26 NOTE — Telephone Encounter (Signed)
Patient is requesting for refill on sildenafil (REVATIO) 20 MG tablet [[id number]] & Alprazolam ( Anxiety Meds for flying ) however I didn't see it on his medication list.. I Explained to patient he need an appt. If so let me know and I will call him to get him scheduled.  Pt also declined Future CPE labs stated it was already taken care of

## 2019-04-14 ENCOUNTER — Telehealth: Payer: Self-pay | Admitting: Internal Medicine

## 2019-04-14 ENCOUNTER — Other Ambulatory Visit: Payer: Self-pay | Admitting: Internal Medicine

## 2019-04-14 NOTE — Telephone Encounter (Signed)
Spoke w/ Pt- informed unable to refill- last ov 03/2018- PCP informed him in November 2020 he would be unable to continue refilling these meds w/o appt. Pt verbalized understanding. He will check schedule and call back to schedule appt.

## 2019-04-14 NOTE — Telephone Encounter (Signed)
Medication:ALPRAZolam (XANAX) 0.5 MG tablet  sildenafil (REVATIO) 20 MG tablet   Has the patient contacted their pharmacy? Yes.   (If no, request that the patient contact the pharmacy for the refill.) (If yes, when and what did the pharmacy advise?) No more refill avb.   Offered patient an apt he declined stated he was relocating   Preferred Pharmacy (with phone number or street name): [facility] [phone] [place] - [address]  [address], [place] [address]   Agent: Please be advised that RX refills may take up to 3 business days. We ask that you follow-up with your pharmacy.

## 2019-08-10 ENCOUNTER — Telehealth: Payer: Self-pay | Admitting: Internal Medicine

## 2019-08-10 NOTE — Telephone Encounter (Signed)
Discussed this w/ Pt in January 2021. See message. Unable to refill until appt.

## 2019-08-10 NOTE — Telephone Encounter (Signed)
Medication: sildenafil (REVATIO) 20 MG tablet [[id number]]    Has the patient contacted their pharmacy? No. (If no, request that the patient contact the pharmacy for the refill.) (If yes, when and what did the pharmacy advise?)  Preferred Pharmacy (with phone number or street name): [id number] [place], [place] - [address]  [address] [place] [address]  Phone:  [phone] Fax:  [phone]  DEA #:  --  Agent: Please be advised that RX refills may take up to 3 business days. We ask that you follow-up with your pharmacy.

## 2019-08-11 ENCOUNTER — Encounter: Payer: Self-pay | Admitting: Internal Medicine

## 2019-08-11 ENCOUNTER — Telehealth (INDEPENDENT_AMBULATORY_CARE_PROVIDER_SITE_OTHER): Payer: Managed Care, Other (non HMO) | Admitting: Internal Medicine

## 2019-08-11 ENCOUNTER — Other Ambulatory Visit: Payer: Self-pay

## 2019-08-11 VITALS — Ht 66.0 in | Wt 210.0 lb

## 2019-08-11 DIAGNOSIS — F419 Anxiety disorder, unspecified: Secondary | ICD-10-CM

## 2019-08-11 DIAGNOSIS — N529 Male erectile dysfunction, unspecified: Secondary | ICD-10-CM

## 2019-08-11 MED ORDER — ALPRAZOLAM 0.5 MG PO TABS: ORAL_TABLET | ORAL | 0 refills | Status: AC

## 2019-08-11 MED ORDER — SILDENAFIL CITRATE 20 MG PO TABS: ORAL_TABLET | ORAL | 0 refills | Status: AC

## 2019-08-11 NOTE — Progress Notes (Signed)
Pre visit review using our clinic review tool, if applicable. No additional management support is needed unless otherwise documented below in the visit note. 

## 2019-08-11 NOTE — Progress Notes (Signed)
   Subjective:    Patient ID: Jordan Melendez, male    DOB: 03-27-1960, 59 y.o.   MRN: [id number]  DOS:  08/11/2019 Type of visit - description: Virtual Visit via Video Note  I connected with the above patient  by a video enabled telemedicine application and verified that I am speaking with the correct person using two identifiers.   THIS ENCOUNTER IS A VIRTUAL VISIT DUE TO COVID-19 - PATIENT WAS NOT SEEN IN THE OFFICE. PATIENT HAS CONSENTED TO VIRTUAL VISIT / TELEMEDICINE VISIT   Location of patient: home  Location of provider: office  I discussed the limitations of evaluation and management by telemedicine and the availability of in person appointments. The patient expressed understanding and agreed to proceed.  Acute Anxiety: Patient is about to take a trip to [place] and needs some Xanax for flying.  Has taken xanax before without problems ED: Needs a refill on sildenafil    Review of Systems He is doing great, denies any headache, chest pain or difficulty breathing  Past Medical History:  Diagnosis Date  . Erectile dysfunction 09/09/2016  . H/O colonoscopy 2012   [name] , [place]    Past Surgical History:  Procedure Laterality Date  . KNEE SURGERY     x 3   . SHOULDER SURGERY Left 2012    Allergies as of 08/11/2019   No Known Allergies     Medication List       Accurate as of Aug 11, 2019  4:14 PM. If you have any questions, ask your nurse or doctor.        ALPRAZolam 0.5 MG tablet Commonly known as: Xanax Half or 1 tablet once daily as needed for anxiety when taking a airplane   diclofenac sodium 1 % Gel Commonly known as: VOLTAREN Apply 4 g topically 4 (four) times daily as needed.   sildenafil 20 MG tablet Commonly known as: REVATIO TAKE 4-5 TABLETS BY MOUTH AT BEDTIME AS NEEDED          Objective:   Physical Exam Ht 5\' 6"  (1.676 m)   Wt 210 lb (95.3 kg)   BMI 33.89 kg/m  This is a virtual video visit, he is alert oriented x3.  In no apparent  distress    Assessment     Assessment Prediabetes (a1c= 6.0 2015) ED- infrequent sx  MSK: See surgical history.  PLAN: Anxiety: Request a refill on Xanax, he use it only when he flies.  Prescription sent, remind him about not driving if he feels sleepy. ED: Request a refill on sildenafil, he has previously used it without any side effects.  How to take it discussed, prescription sent. Social: Living in , still comes to [place] from time to time, recommend to see a doctor regularly, I will be happy to see him if so desired if not recommend to get a local MD in his new location. RTC as needed  I discussed the assessment and treatment plan with the patient. The patient was provided an opportunity to ask questions and all were answered. The patient agreed with the plan and demonstrated an understanding of the instructions.   The patient was advised to call back or seek an in-person evaluation if the symptoms worsen or if the condition fails to improve as anticipated.

## 2019-08-13 NOTE — Assessment & Plan Note (Signed)
Anxiety: Request a refill on Xanax, he use it only when he flies.  Prescription sent, remind him about not driving if he feels sleepy. ED: Request a refill on sildenafil, he has previously used it without any side effects.  How to take it discussed, prescription sent. Social: Living in [place], still comes to [place] from time to time, recommend to see a doctor regularly, I will be happy to see him if so desired if not recommend to get a local MD in his new location. RTC as needed

## 2020-10-15 IMAGING — CT CT ABD-PELV W/ CM
2 of 5 series · 16 of 46 positions shown, 18 images · IV contrast (APPLIED)
Comparison: None.

CLINICAL DATA: 57-year-old and recent fall.  Left abdominal pain.

EXAM:
CT ABDOMEN AND PELVIS WITH CONTRAST
TECHNIQUE: Multidetector CT imaging of the abdomen and pelvis was performed
using the standard protocol following bolus administration of
intravenous contrast.
CONTRAST:  100mL POGK9U-ELL IOPAMIDOL (POGK9U-ELL) INJECTION 61%

[Series 2: axial st · axial · 0.89mm/px · z∈[-338,+117]mm · 13 of 103 slices shown, 15 images]
[im 6/103  soft-tissue]
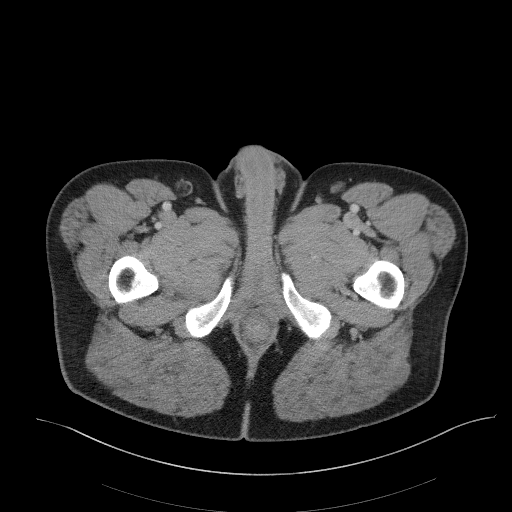
[im 6/103  bone]
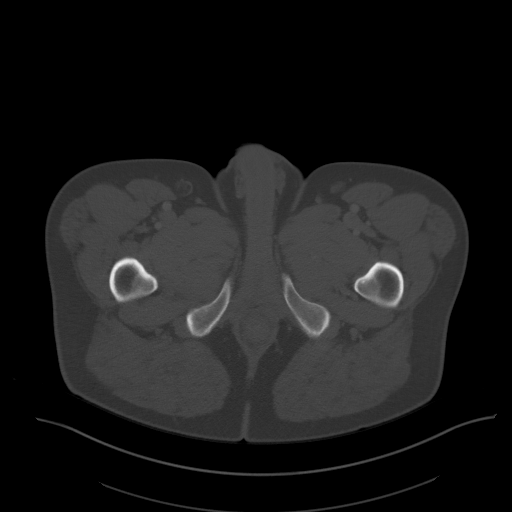
[im 17/103  soft-tissue]
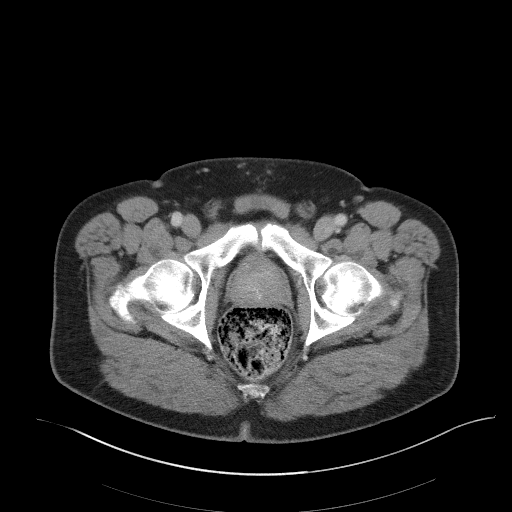
[im 22/103  soft-tissue]
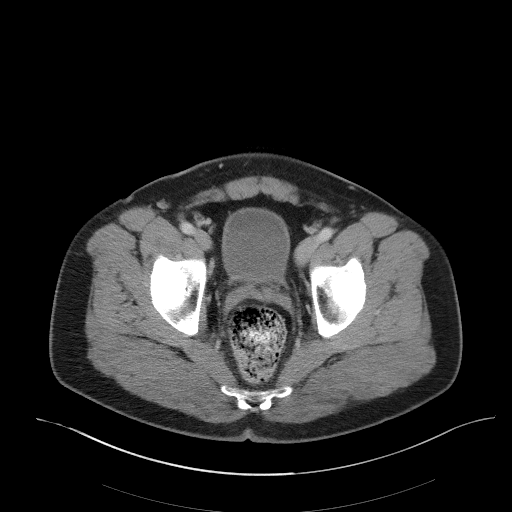
[im 27/103  soft-tissue]
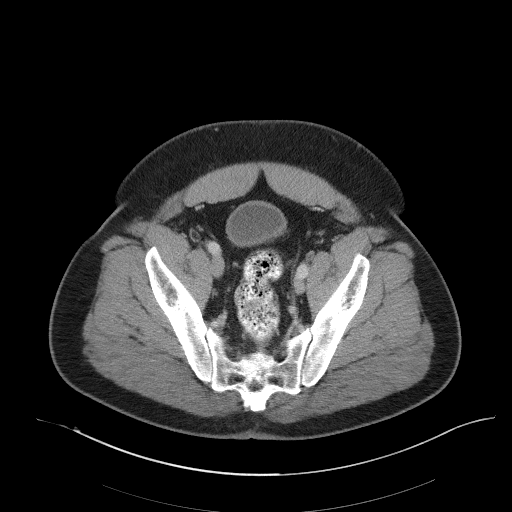
[im 38/103  soft-tissue]
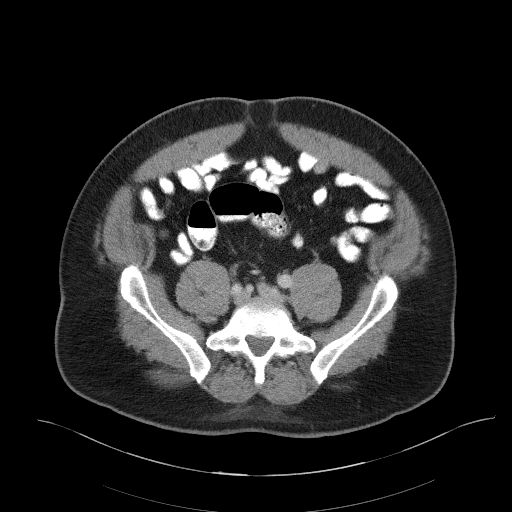
[im 43/103  soft-tissue]
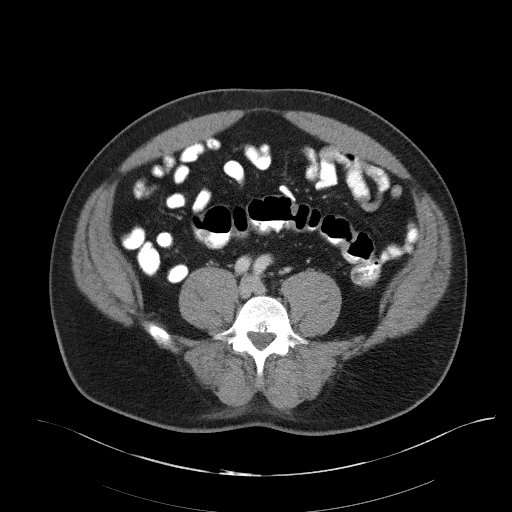
[im 54/103  soft-tissue]
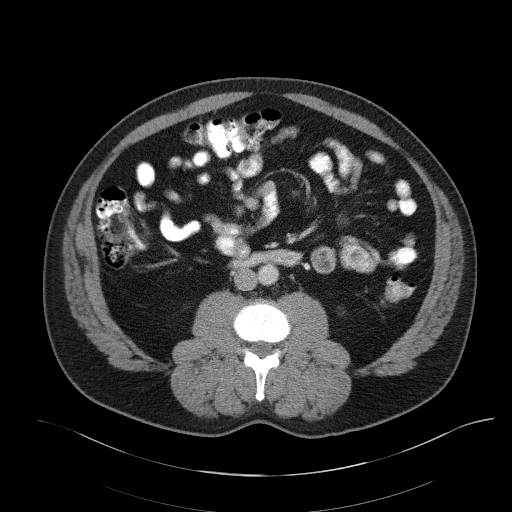
[im 60/103  soft-tissue]
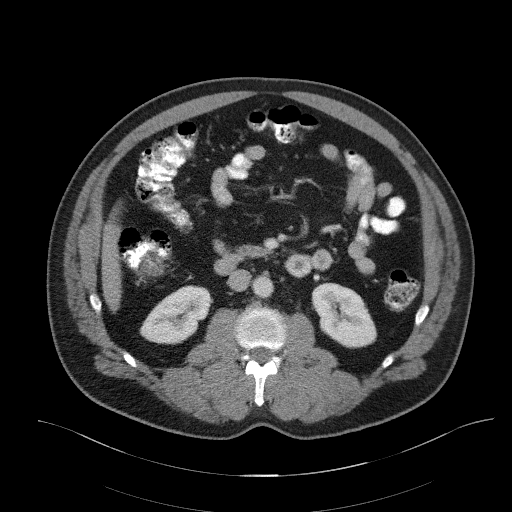
[im 65/103  soft-tissue]
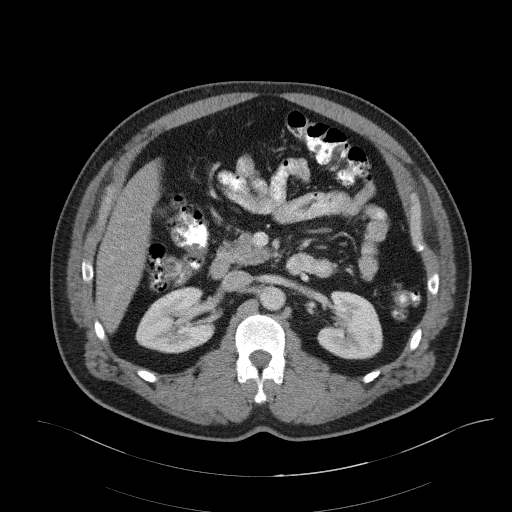
[im 65/103  bone]
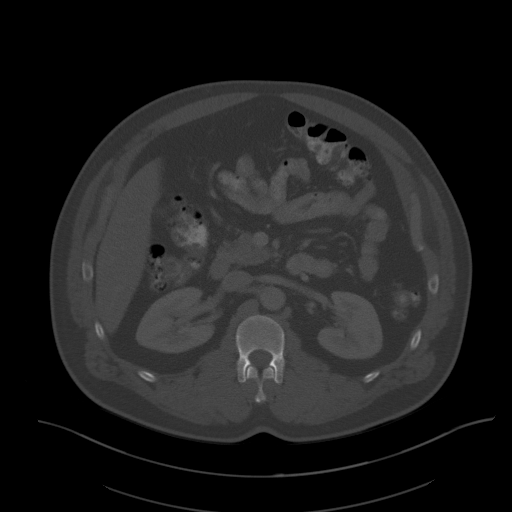
[im 76/103  soft-tissue]
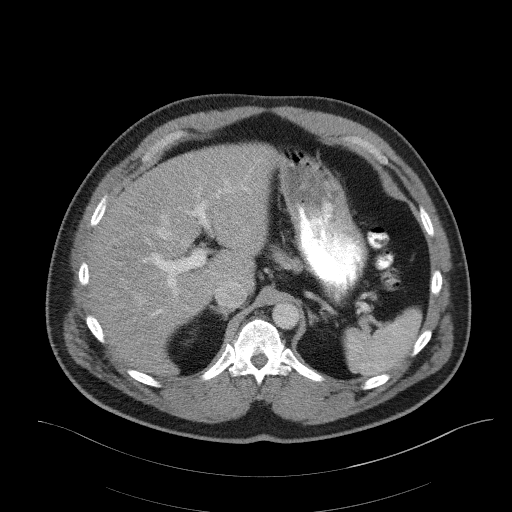
[im 81/103  soft-tissue]
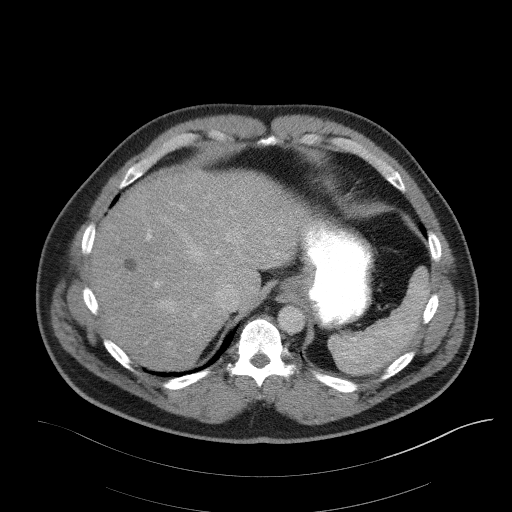
[im 86/103  soft-tissue]
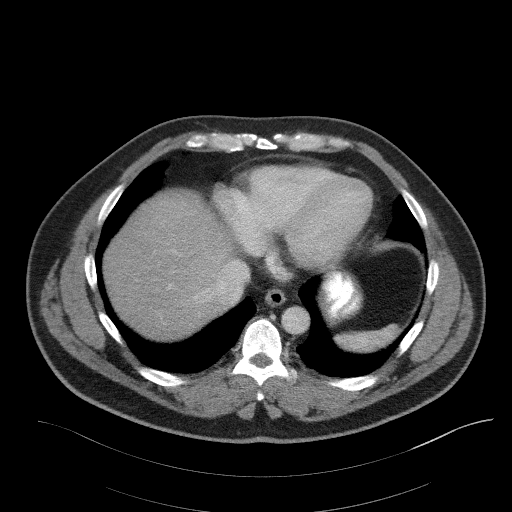
[im 97/103  soft-tissue]
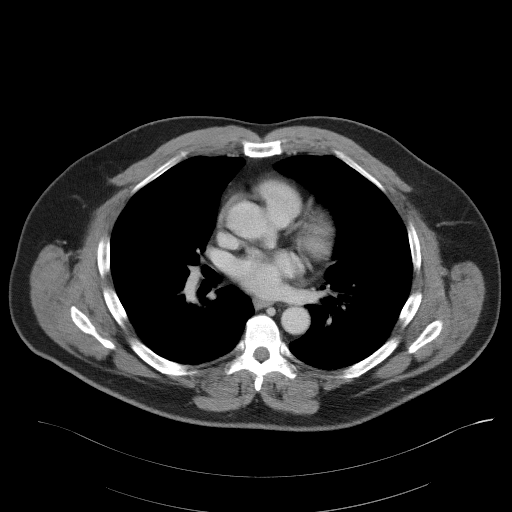

[Series 5: coronal st · coronal · 0.79mm/px · 3 of 107 slices shown]
[im 36/107  soft-tissue]
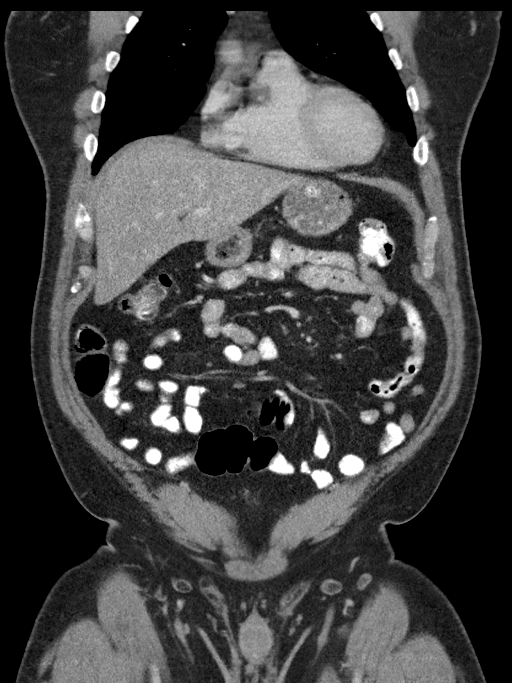
[im 48/107  soft-tissue]
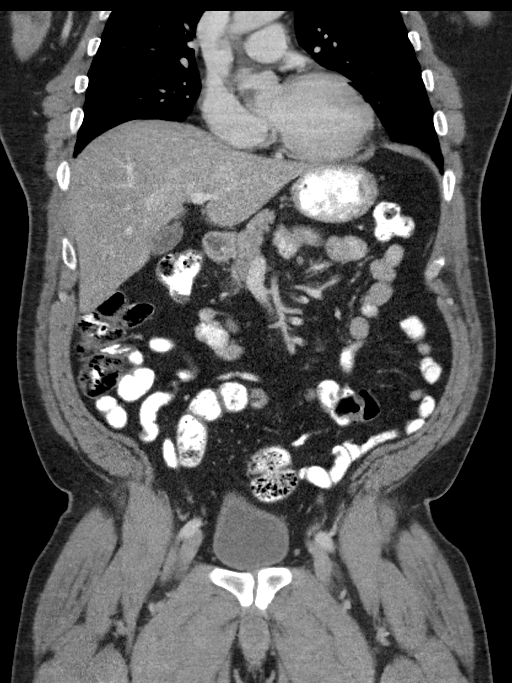
[im 59/107  soft-tissue]
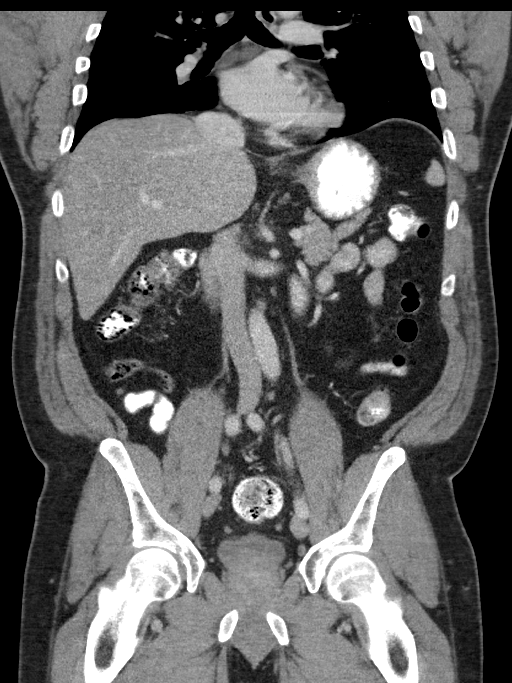

[16 of 46 positions shown; findings below may reference images not displayed]

FINDINGS: Lower chest: Lung bases are clear.  No pleural effusions.

Hepatobiliary: 1.4 cm low-density structure in the right hepatic
lobe is suggestive for a cyst. Liver is slightly decreased
attenuation. Portal venous system is patent. Normal appearance of
the gallbladder. No biliary dilatation.

Pancreas: Unremarkable. No pancreatic ductal dilatation or
surrounding inflammatory changes.

Spleen: Normal in size without focal abnormality.

Adrenals/Urinary Tract: Adrenal glands are unremarkable. Kidneys are
normal, without renal calculi, or hydronephrosis. Question a tiny
cyst in the mid left kidney. Bladder is unremarkable.

Stomach/Bowel: Large amount of stool in the rectum. Oral contrast in
small bowel and large bowel. No evidence for bowel obstruction or
inflammation. Normal appendix. Stomach is unremarkable.

Vascular/Lymphatic: No significant vascular findings are present. No
enlarged abdominal or pelvic lymph nodes.

Reproductive: Few calcifications in the prostate. Prostate is mildly
enlarged measuring 5.3 cm in transverse dimension.

Other: No free fluid. Negative for free air. Small umbilical hernia
containing fat.

Musculoskeletal: Irregularity involving the left pubic bone appears
chronic. No acute bone abnormality in the abdomen or pelvis.
IMPRESSION: 1. No acute abnormality in the abdomen or pelvis.
2. Rectum is distended with a large amount of stool.
3. Small umbilical hernia containing fat.
4. Mild prostate enlargement.
